# Patient Record
Sex: Male | Born: 1945 | Race: White | Hispanic: No
Health system: Southern US, Community
[De-identification: ages and names within clinical notes are randomized; demographics above are authoritative.]

---

## 2003-11-07 ENCOUNTER — Other Ambulatory Visit: Payer: Self-pay

## 2004-10-09 ENCOUNTER — Inpatient Hospital Stay: Payer: Self-pay

## 2004-12-18 ENCOUNTER — Ambulatory Visit: Payer: Self-pay | Admitting: Cardiovascular Disease

## 2004-12-19 ENCOUNTER — Ambulatory Visit: Payer: Self-pay | Admitting: Internal Medicine

## 2004-12-25 ENCOUNTER — Ambulatory Visit: Payer: Self-pay | Admitting: Oncology

## 2005-01-23 ENCOUNTER — Ambulatory Visit (HOSPITAL_COMMUNITY): Admission: RE | Admit: 2005-01-23 | Discharge: 2005-01-23 | Payer: Self-pay | Admitting: Oncology

## 2005-02-25 ENCOUNTER — Ambulatory Visit: Payer: Self-pay | Admitting: Oncology

## 2005-04-26 ENCOUNTER — Ambulatory Visit: Payer: Self-pay | Admitting: Oncology

## 2005-07-10 ENCOUNTER — Ambulatory Visit: Payer: Self-pay | Admitting: Oncology

## 2005-09-18 ENCOUNTER — Ambulatory Visit: Payer: Self-pay | Admitting: Oncology

## 2005-12-09 ENCOUNTER — Ambulatory Visit: Payer: Self-pay | Admitting: Oncology

## 2005-12-10 LAB — CBC WITH DIFFERENTIAL (CANCER CENTER ONLY)
Eosinophils Absolute: 0.2 10*3/uL (ref 0.0–0.5)
LYMPH#: 2.3 10*3/uL (ref 0.9–3.3)
LYMPH%: 37.2 % (ref 14.0–48.0)
MCH: 28.1 pg (ref 28.0–33.4)
MCHC: 32.6 g/dL (ref 32.0–35.9)
MONO%: 9.1 % (ref 0.0–13.0)
Platelets: 124 10*3/uL — ABNORMAL LOW (ref 145–400)
RBC: 5.21 10*6/uL (ref 4.20–5.70)
RDW: 14.4 % (ref 10.5–14.6)

## 2005-12-10 LAB — BASIC METABOLIC PANEL
CO2: 33 mEq/L — ABNORMAL HIGH (ref 19–32)
Chloride: 96 mEq/L (ref 96–112)

## 2005-12-10 LAB — LACTATE DEHYDROGENASE: LDH: 194 U/L (ref 94–250)

## 2006-02-03 ENCOUNTER — Ambulatory Visit: Payer: Self-pay | Admitting: Oncology

## 2006-02-04 LAB — CBC WITH DIFFERENTIAL (CANCER CENTER ONLY)
BASO%: 1 % (ref 0.0–2.0)
EOS%: 3 % (ref 0.0–7.0)
Eosinophils Absolute: 0.2 10*3/uL (ref 0.0–0.5)
HCT: 47.4 % (ref 38.7–49.9)
MCH: 29.3 pg (ref 28.0–33.4)
MONO#: 0.6 10*3/uL (ref 0.1–0.9)
MONO%: 9.1 % (ref 0.0–13.0)
NEUT#: 3 10*3/uL (ref 1.5–6.5)
NEUT%: 45.6 % (ref 40.0–80.0)
Platelets: 119 10*3/uL — ABNORMAL LOW (ref 145–400)
WBC: 6.6 10*3/uL (ref 4.0–10.0)

## 2006-03-04 LAB — CBC WITH DIFFERENTIAL (CANCER CENTER ONLY)
HCT: 44.8 % (ref 38.7–49.9)
HGB: 14.5 g/dL (ref 13.0–17.1)
MCHC: 32.4 g/dL (ref 32.0–35.9)
MONO#: 0.5 10*3/uL (ref 0.1–0.9)
NEUT#: 2.4 10*3/uL (ref 1.5–6.5)
NEUT%: 40 % (ref 40.0–80.0)
Platelets: 107 10*3/uL — ABNORMAL LOW (ref 145–400)
RBC: 5 10*6/uL (ref 4.20–5.70)
RDW: 13.9 % (ref 10.5–14.6)

## 2006-04-03 ENCOUNTER — Ambulatory Visit: Payer: Self-pay | Admitting: Family Medicine

## 2006-04-15 ENCOUNTER — Ambulatory Visit: Payer: Self-pay | Admitting: Oncology

## 2006-05-29 ENCOUNTER — Ambulatory Visit: Payer: Self-pay | Admitting: Oncology

## 2006-07-14 ENCOUNTER — Other Ambulatory Visit: Payer: Self-pay

## 2006-07-14 ENCOUNTER — Inpatient Hospital Stay: Payer: Self-pay | Admitting: Internal Medicine

## 2006-09-01 ENCOUNTER — Ambulatory Visit: Payer: Self-pay | Admitting: Oncology

## 2006-09-02 LAB — BASIC METABOLIC PANEL - CANCER CENTER ONLY
BUN, Bld: 13 mg/dL (ref 7–22)
Calcium: 9.8 mg/dL (ref 8.0–10.3)
Chloride: 94 mEq/L — ABNORMAL LOW (ref 98–108)
Creat: 0.8 mg/dl (ref 0.6–1.2)
Sodium: 139 mEq/L (ref 128–145)

## 2006-09-02 LAB — CBC WITH DIFFERENTIAL (CANCER CENTER ONLY)
BASO#: 0 10*3/uL (ref 0.0–0.2)
BASO%: 0.7 % (ref 0.0–2.0)
EOS%: 3.3 % (ref 0.0–7.0)
HCT: 46.9 % (ref 38.7–49.9)
LYMPH#: 2.6 10*3/uL (ref 0.9–3.3)
MCV: 91 fL (ref 82–98)
MONO%: 6.7 % (ref 0.0–13.0)
NEUT%: 45.1 % (ref 40.0–80.0)
RBC: 5.15 10*6/uL (ref 4.20–5.70)
RDW: 13.9 % (ref 10.5–14.6)
WBC: 5.9 10*3/uL (ref 4.0–10.0)

## 2006-09-18 LAB — CBC WITH DIFFERENTIAL (CANCER CENTER ONLY)
EOS%: 3.3 % (ref 0.0–7.0)
HCT: 44.1 % (ref 38.7–49.9)
HGB: 14.8 g/dL (ref 13.0–17.1)
MCHC: 33.5 g/dL (ref 32.0–35.9)
MCV: 90 fL (ref 82–98)
NEUT%: 45.2 % (ref 40.0–80.0)
RBC: 4.88 10*6/uL (ref 4.20–5.70)

## 2006-10-15 ENCOUNTER — Ambulatory Visit: Payer: Self-pay | Admitting: Oncology

## 2006-10-16 LAB — CBC WITH DIFFERENTIAL (CANCER CENTER ONLY)
BASO#: 0 10*3/uL (ref 0.0–0.2)
BASO%: 0.6 % (ref 0.0–2.0)
Eosinophils Absolute: 0.2 10*3/uL (ref 0.0–0.5)
HCT: 42.2 % (ref 38.7–49.9)
HGB: 14.2 g/dL (ref 13.0–17.1)
LYMPH#: 2.6 10*3/uL (ref 0.9–3.3)
MCHC: 33.5 g/dL (ref 32.0–35.9)
MCV: 88 fL (ref 82–98)
MONO#: 0.5 10*3/uL (ref 0.1–0.9)
MONO%: 8.5 % (ref 0.0–13.0)
RBC: 4.82 10*6/uL (ref 4.20–5.70)

## 2006-11-18 LAB — CBC WITH DIFFERENTIAL (CANCER CENTER ONLY)
BASO#: 0.1 10*3/uL (ref 0.0–0.2)
EOS%: 2.6 % (ref 0.0–7.0)
LYMPH%: 36.2 % (ref 14.0–48.0)
MCHC: 33.5 g/dL (ref 32.0–35.9)
MONO%: 9 % (ref 0.0–13.0)
NEUT%: 51.4 % (ref 40.0–80.0)
Platelets: 134 10*3/uL — ABNORMAL LOW (ref 145–400)
RBC: 5.44 10*6/uL (ref 4.20–5.70)
RDW: 13.1 % (ref 10.5–14.6)

## 2006-11-18 LAB — IRON AND TIBC
%SAT: 17 % — ABNORMAL LOW (ref 20–55)
Iron: 65 ug/dL (ref 42–165)
UIBC: 328 ug/dL

## 2006-12-22 ENCOUNTER — Ambulatory Visit: Payer: Self-pay | Admitting: Oncology

## 2006-12-23 LAB — CBC WITH DIFFERENTIAL (CANCER CENTER ONLY)
BASO%: 0.6 % (ref 0.0–2.0)
EOS%: 3 % (ref 0.0–7.0)
Eosinophils Absolute: 0.2 10*3/uL (ref 0.0–0.5)
HCT: 42.6 % (ref 38.7–49.9)
HGB: 13.9 g/dL (ref 13.0–17.1)
LYMPH#: 2.9 10*3/uL (ref 0.9–3.3)
MCV: 82 fL (ref 82–98)

## 2007-01-22 ENCOUNTER — Ambulatory Visit: Payer: Self-pay | Admitting: Cardiovascular Disease

## 2007-01-22 ENCOUNTER — Other Ambulatory Visit: Payer: Self-pay

## 2007-01-28 LAB — CBC WITH DIFFERENTIAL (CANCER CENTER ONLY)
BASO#: 0.1 10*3/uL (ref 0.0–0.2)
Eosinophils Absolute: 0.2 10*3/uL (ref 0.0–0.5)
HCT: 41 % (ref 38.7–49.9)
LYMPH#: 2.8 10*3/uL (ref 0.9–3.3)
LYMPH%: 40.3 % (ref 14.0–48.0)
NEUT#: 3.2 10*3/uL (ref 1.5–6.5)
Platelets: 152 10*3/uL (ref 145–400)
RBC: 5.03 10*6/uL (ref 4.20–5.70)
WBC: 6.9 10*3/uL (ref 4.0–10.0)

## 2007-03-09 ENCOUNTER — Ambulatory Visit: Payer: Self-pay | Admitting: Oncology

## 2007-03-13 LAB — CBC WITH DIFFERENTIAL (CANCER CENTER ONLY)
BASO#: 0 10*3/uL (ref 0.0–0.2)
BASO%: 0.5 % (ref 0.0–2.0)
LYMPH#: 2.9 10*3/uL (ref 0.9–3.3)
MONO%: 9.3 % (ref 0.0–13.0)
NEUT#: 2.7 10*3/uL (ref 1.5–6.5)
NEUT%: 41.8 % (ref 40.0–80.0)
Platelets: 134 10*3/uL — ABNORMAL LOW (ref 145–400)
RBC: 5.17 10*6/uL (ref 4.20–5.70)
RDW: 14.8 % — ABNORMAL HIGH (ref 10.5–14.6)
WBC: 6.4 10*3/uL (ref 4.0–10.0)

## 2007-03-13 LAB — IRON AND TIBC
%SAT: 16 % — ABNORMAL LOW (ref 20–55)
UIBC: 338 ug/dL

## 2007-03-13 LAB — FERRITIN: Ferritin: 8 ng/mL — ABNORMAL LOW (ref 22–322)

## 2007-03-20 IMAGING — CR DG CHEST 2V
1 series · 3 of 3 positions shown · non-contrast
Comparison: none

REASON FOR EXAM: Shortness of breath
COMMENTS:

[Series 1: view not recorded · 0.17mm/px · 3 of 3 slices shown]
[im 1/3]
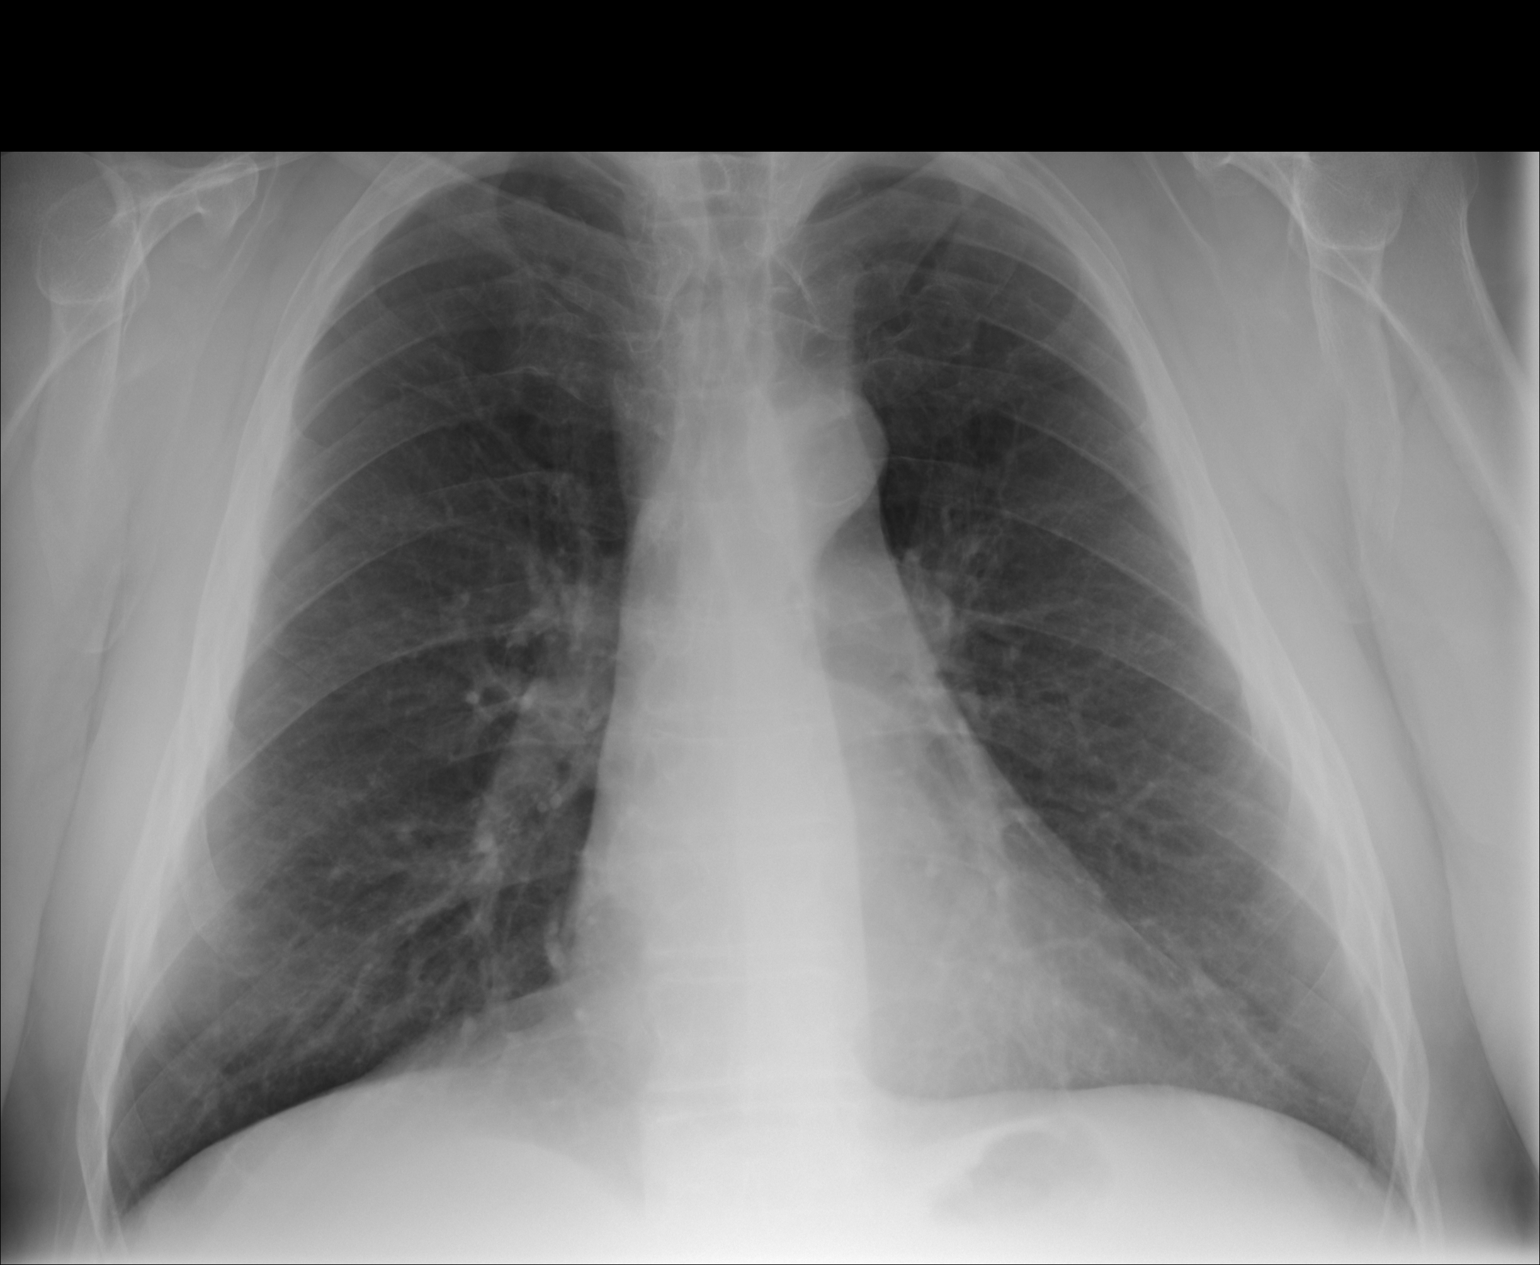
[im 2/3]
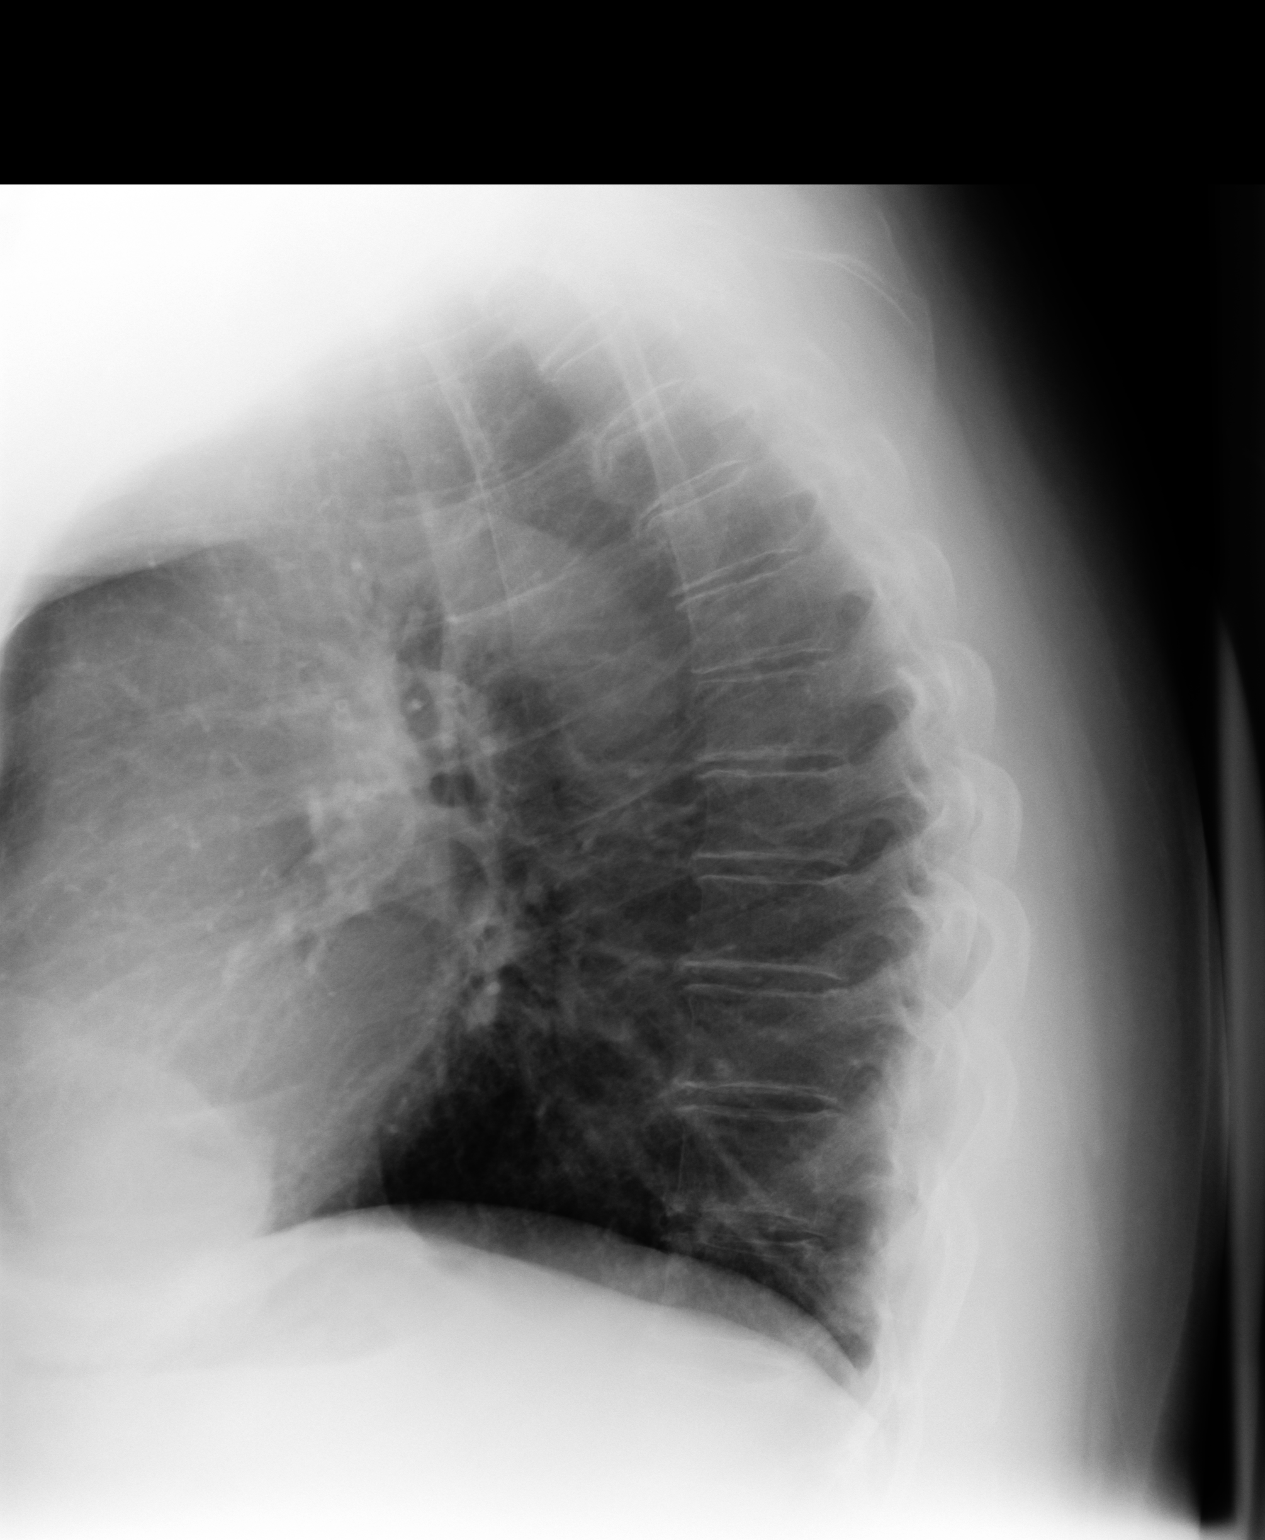
[im 3/3]
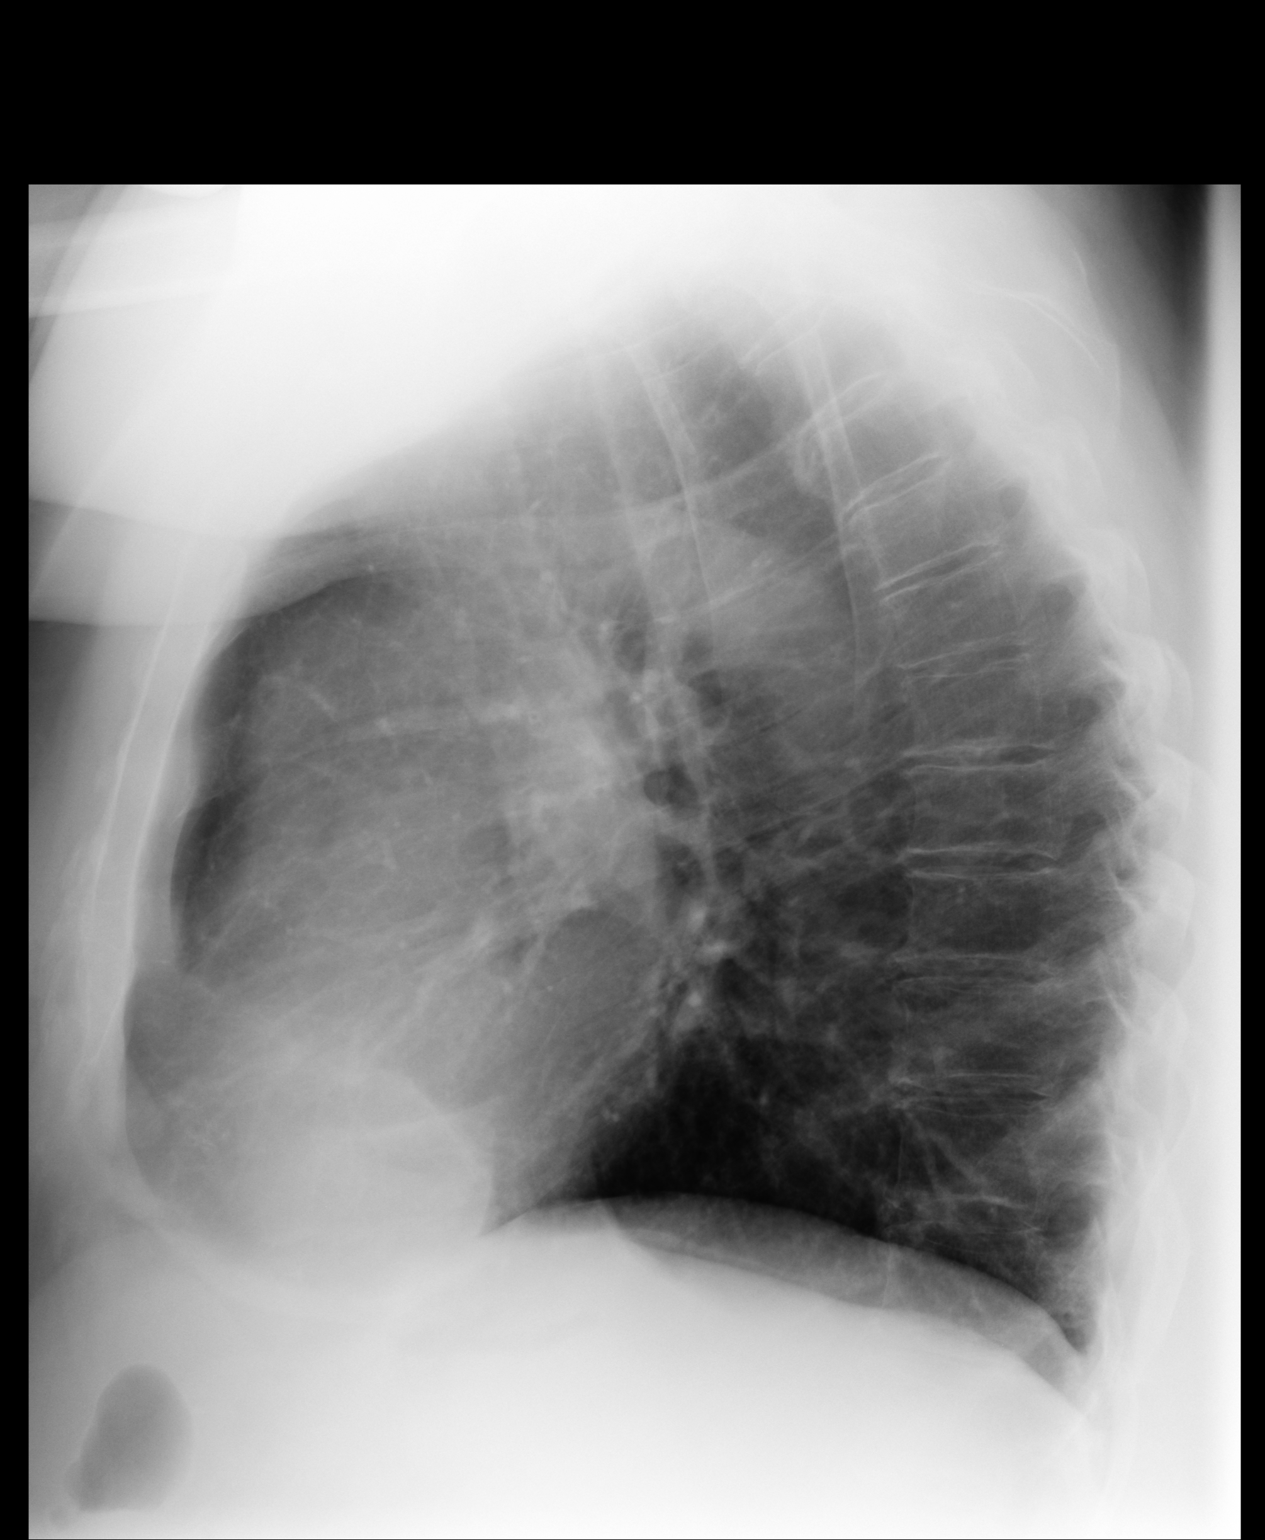

[3 of 3 positions shown; findings below may reference images not displayed]

PROCEDURE:     DXR - DXR CHEST PA (OR AP) AND LATERAL  - April 03, 2006 [DATE]

RESULT:     The current exam is compared to a prior exam of 10/09/2004. The
lung fields are clear. No pneumonia, pneumothorax or pleural effusion is
seen. The heart and pulmonary vasculature show no significant abnormalities.
The chest appears slightly hyperexpanded. This may be due to body habitus
but the possibility of minimal manifestation of COPD cannot be excluded and
is mentioned for correlation with clinical findings.
IMPRESSION: 1.     No acute changes are identified.
2.     Possible mild hyperexpansion of the lungs.

## 2007-04-28 ENCOUNTER — Ambulatory Visit: Payer: Self-pay | Admitting: Oncology

## 2007-04-30 LAB — CBC WITH DIFFERENTIAL (CANCER CENTER ONLY)
EOS%: 3.9 % (ref 0.0–7.0)
HCT: 39.4 % (ref 38.7–49.9)
HGB: 13.2 g/dL (ref 13.0–17.1)
LYMPH#: 1.9 10*3/uL (ref 0.9–3.3)
MCHC: 33.4 g/dL (ref 32.0–35.9)
NEUT%: 54.1 % (ref 40.0–80.0)
RBC: 4.78 10*6/uL (ref 4.20–5.70)

## 2007-05-12 ENCOUNTER — Ambulatory Visit: Payer: Self-pay | Admitting: Internal Medicine

## 2007-06-08 LAB — CBC WITH DIFFERENTIAL (CANCER CENTER ONLY)
Eosinophils Absolute: 0.2 10*3/uL (ref 0.0–0.5)
HGB: 13.6 g/dL (ref 13.0–17.1)
LYMPH#: 2 10*3/uL (ref 0.9–3.3)
MONO%: 8.3 % (ref 0.0–13.0)
NEUT#: 3.2 10*3/uL (ref 1.5–6.5)
Platelets: 113 10*3/uL — ABNORMAL LOW (ref 145–400)
WBC: 5.9 10*3/uL (ref 4.0–10.0)

## 2007-06-09 LAB — IRON AND TIBC: %SAT: 17 % — ABNORMAL LOW (ref 20–55)

## 2007-06-15 ENCOUNTER — Ambulatory Visit: Payer: Self-pay | Admitting: Internal Medicine

## 2007-09-01 ENCOUNTER — Ambulatory Visit: Payer: Self-pay | Admitting: Cardiovascular Disease

## 2007-11-26 ENCOUNTER — Ambulatory Visit: Payer: Self-pay | Admitting: Internal Medicine

## 2008-01-13 ENCOUNTER — Ambulatory Visit: Payer: Self-pay | Admitting: Gastroenterology

## 2008-01-14 ENCOUNTER — Ambulatory Visit: Payer: Self-pay | Admitting: Gastroenterology

## 2008-06-07 ENCOUNTER — Inpatient Hospital Stay: Payer: Self-pay | Admitting: Internal Medicine

## 2008-07-26 ENCOUNTER — Ambulatory Visit: Payer: Self-pay | Admitting: Family Medicine

## 2010-07-21 ENCOUNTER — Encounter: Payer: Self-pay | Admitting: Oncology

## 2010-07-22 ENCOUNTER — Encounter: Payer: Self-pay | Admitting: Oncology

## 2011-11-29 ENCOUNTER — Inpatient Hospital Stay: Payer: Self-pay | Admitting: Internal Medicine

## 2011-11-29 LAB — URINALYSIS, COMPLETE
Bilirubin,UR: NEGATIVE
Glucose,UR: >=500 mg/dL (ref 0–75)
Leukocyte Esterase: NEGATIVE
Ph: 6 (ref 4.5–8.0)
Protein: >=500
Specific Gravity: 1.028 (ref 1.003–1.030)
WBC UR: 4 /HPF (ref 0–5)

## 2011-11-29 LAB — COMPREHENSIVE METABOLIC PANEL
Albumin: 3 g/dL — ABNORMAL LOW (ref 3.4–5.0)
Bilirubin,Total: 1.6 mg/dL — ABNORMAL HIGH (ref 0.2–1.0)
Creatinine: 0.75 mg/dL (ref 0.60–1.30)
EGFR (Non-African Amer.): >60
Glucose: 180 mg/dL — ABNORMAL HIGH (ref 65–99)
Potassium: 3.8 mmol/L (ref 3.5–5.1)
SGPT (ALT): 28 U/L

## 2011-11-29 LAB — CK TOTAL AND CKMB (NOT AT ARMC)
CK, Total: 5231 U/L — ABNORMAL HIGH (ref 35–232)
CK-MB: 5.1 ng/mL — ABNORMAL HIGH (ref 0.5–3.6)

## 2011-11-29 LAB — CBC
HGB: 15 g/dL (ref 13.0–18.0)
MCH: 29.5 pg (ref 26.0–34.0)
MCHC: 34.3 g/dL (ref 32.0–36.0)
Platelet: 170 10*3/uL (ref 150–440)
RBC: 5.08 10*6/uL (ref 4.40–5.90)
RDW: 14.8 % — ABNORMAL HIGH (ref 11.5–14.5)

## 2011-11-29 LAB — TROPONIN I: Troponin-I: 0.04 ng/mL

## 2011-11-30 LAB — COMPREHENSIVE METABOLIC PANEL
BUN: 13 mg/dL (ref 7–18)
Creatinine: 0.56 mg/dL — ABNORMAL LOW (ref 0.60–1.30)
EGFR (Non-African Amer.): >60
Osmolality: 270 (ref 275–301)
Potassium: 3.6 mmol/L (ref 3.5–5.1)
SGOT(AST): 56 U/L — ABNORMAL HIGH (ref 15–37)
SGPT (ALT): 28 U/L
Sodium: 132 mmol/L — ABNORMAL LOW (ref 136–145)

## 2011-11-30 LAB — CBC WITH DIFFERENTIAL/PLATELET
Basophil %: 0.5 %
Eosinophil #: 0.2 10*3/uL (ref 0.0–0.7)
Eosinophil %: 1.7 %
HCT: 34.7 % — ABNORMAL LOW (ref 40.0–52.0)
Lymphocyte #: 1.2 10*3/uL (ref 1.0–3.6)
Lymphocyte %: 12.3 %
MCH: 29.2 pg (ref 26.0–34.0)
MCHC: 34.2 g/dL (ref 32.0–36.0)
MCV: 86 fL (ref 80–100)
Monocyte %: 11.4 %
Neutrophil %: 74.1 %
RBC: 4.06 10*6/uL — ABNORMAL LOW (ref 4.40–5.90)
RDW: 15 % — ABNORMAL HIGH (ref 11.5–14.5)
WBC: 9.4 10*3/uL (ref 3.8–10.6)

## 2011-11-30 LAB — HEMOGLOBIN A1C: Hemoglobin A1C: 10.5 % — ABNORMAL HIGH (ref 4.2–6.3)

## 2011-11-30 LAB — CK-MB: CK-MB: 1.6 ng/mL (ref 0.5–3.6)

## 2011-11-30 LAB — URINE CULTURE

## 2011-11-30 LAB — TROPONIN I: Troponin-I: <0.02 ng/mL

## 2011-11-30 LAB — PROTIME-INR: INR: 1.1

## 2011-12-01 LAB — BASIC METABOLIC PANEL
Anion Gap: 9 (ref 7–16)
BUN: 9 mg/dL (ref 7–18)
Calcium, Total: 8.3 mg/dL — ABNORMAL LOW (ref 8.5–10.1)
Chloride: 96 mmol/L — ABNORMAL LOW (ref 98–107)
EGFR (Non-African Amer.): >60
Glucose: 228 mg/dL — ABNORMAL HIGH (ref 65–99)
Osmolality: 270 (ref 275–301)
Sodium: 132 mmol/L — ABNORMAL LOW (ref 136–145)

## 2011-12-01 LAB — CK: CK, Total: 263 U/L — ABNORMAL HIGH (ref 35–232)

## 2011-12-02 LAB — CBC WITH DIFFERENTIAL/PLATELET
Basophil #: 0.1 10*3/uL (ref 0.0–0.1)
HCT: 35.5 % — ABNORMAL LOW (ref 40.0–52.0)
HGB: 12 g/dL — ABNORMAL LOW (ref 13.0–18.0)
Lymphocyte #: 1.4 10*3/uL (ref 1.0–3.6)
Lymphocyte %: 19.6 %
MCHC: 33.7 g/dL (ref 32.0–36.0)
Monocyte %: 11.5 %
Neutrophil %: 64.4 %
RBC: 4.15 10*6/uL — ABNORMAL LOW (ref 4.40–5.90)
RDW: 14.5 % (ref 11.5–14.5)

## 2011-12-03 LAB — BASIC METABOLIC PANEL
Anion Gap: 8 (ref 7–16)
BUN: 9 mg/dL (ref 7–18)
Calcium, Total: 8.9 mg/dL (ref 8.5–10.1)
Co2: 28 mmol/L (ref 21–32)
EGFR (Non-African Amer.): >60
Potassium: 4.2 mmol/L (ref 3.5–5.1)
Sodium: 132 mmol/L — ABNORMAL LOW (ref 136–145)

## 2011-12-03 LAB — PATHOLOGY REPORT

## 2011-12-04 LAB — CULTURE, BLOOD (SINGLE)

## 2011-12-04 LAB — WOUND CULTURE

## 2011-12-05 LAB — BASIC METABOLIC PANEL
BUN: 17 mg/dL (ref 7–18)
Chloride: 95 mmol/L — ABNORMAL LOW (ref 98–107)
Co2: 28 mmol/L (ref 21–32)
EGFR (African American): >60
EGFR (Non-African Amer.): >60
Glucose: 259 mg/dL — ABNORMAL HIGH (ref 65–99)
Osmolality: 269 (ref 275–301)
Potassium: 4.8 mmol/L (ref 3.5–5.1)

## 2011-12-05 LAB — MAGNESIUM: Magnesium: 1.7 mg/dL — ABNORMAL LOW

## 2012-07-03 ENCOUNTER — Inpatient Hospital Stay: Payer: Self-pay | Admitting: Internal Medicine

## 2012-07-03 LAB — COMPREHENSIVE METABOLIC PANEL
Alkaline Phosphatase: 236 U/L — ABNORMAL HIGH (ref 50–136)
Anion Gap: 7 (ref 7–16)
Bilirubin,Total: 0.6 mg/dL (ref 0.2–1.0)
Chloride: 95 mmol/L — ABNORMAL LOW (ref 98–107)
EGFR (Non-African Amer.): 57 — ABNORMAL LOW
Osmolality: 281 (ref 275–301)
SGOT(AST): 14 U/L — ABNORMAL LOW (ref 15–37)
Total Protein: 7.8 g/dL (ref 6.4–8.2)

## 2012-07-03 LAB — ETHANOL
Ethanol %: <0.003 % (ref 0.000–0.080)
Ethanol: <3 mg/dL

## 2012-07-03 LAB — URINALYSIS, COMPLETE
Bilirubin,UR: NEGATIVE
Glucose,UR: >=500 mg/dL (ref 0–75)
Ketone: NEGATIVE
Nitrite: NEGATIVE
Ph: 6 (ref 4.5–8.0)
Protein: 30
RBC,UR: 11 /HPF (ref 0–5)
Specific Gravity: 1.013 (ref 1.003–1.030)
Squamous Epithelial: NONE SEEN

## 2012-07-03 LAB — CBC WITH DIFFERENTIAL/PLATELET
Basophil %: 0.3 %
Eosinophil %: 0.2 %
HCT: 35 % — ABNORMAL LOW (ref 40.0–52.0)
HGB: 11.5 g/dL — ABNORMAL LOW (ref 13.0–18.0)
Lymphocyte #: 1.1 10*3/uL (ref 1.0–3.6)
Lymphocyte %: 6.7 %
MCH: 28 pg (ref 26.0–34.0)
MCV: 85 fL (ref 80–100)
Neutrophil #: 13.3 10*3/uL — ABNORMAL HIGH (ref 1.4–6.5)
Platelet: 176 10*3/uL (ref 150–440)
RBC: 4.1 10*6/uL — ABNORMAL LOW (ref 4.40–5.90)
RDW: 16.2 % — ABNORMAL HIGH (ref 11.5–14.5)
WBC: 15.7 10*3/uL — ABNORMAL HIGH (ref 3.8–10.6)

## 2012-07-03 LAB — CK TOTAL AND CKMB (NOT AT ARMC): CK, Total: 47 U/L (ref 35–232)

## 2012-07-04 LAB — CBC WITH DIFFERENTIAL/PLATELET
Basophil %: 0.4 %
HCT: 32.5 % — ABNORMAL LOW (ref 40.0–52.0)
HGB: 11.1 g/dL — ABNORMAL LOW (ref 13.0–18.0)
Lymphocyte #: 1.4 10*3/uL (ref 1.0–3.6)
MCH: 28.3 pg (ref 26.0–34.0)
Monocyte #: 1.4 x10 3/mm — ABNORMAL HIGH (ref 0.2–1.0)
Platelet: 162 10*3/uL (ref 150–440)
RBC: 3.91 10*6/uL — ABNORMAL LOW (ref 4.40–5.90)
WBC: 14.3 10*3/uL — ABNORMAL HIGH (ref 3.8–10.6)

## 2012-07-04 LAB — BASIC METABOLIC PANEL
Anion Gap: 9 (ref 7–16)
Calcium, Total: 8.8 mg/dL (ref 8.5–10.1)
Chloride: 107 mmol/L (ref 98–107)
Co2: 23 mmol/L (ref 21–32)
Creatinine: 0.94 mg/dL (ref 0.60–1.30)
EGFR (African American): >60
EGFR (Non-African Amer.): >60
Potassium: 4.2 mmol/L (ref 3.5–5.1)

## 2012-07-04 LAB — DRUG SCREEN, URINE
Amphetamines, Ur Screen: NEGATIVE (ref ?–1000)
Cannabinoid 50 Ng, Ur ~~LOC~~: NEGATIVE (ref ?–50)
Cocaine Metabolite,Ur ~~LOC~~: NEGATIVE (ref ?–300)
MDMA (Ecstasy)Ur Screen: NEGATIVE (ref ?–500)
Methadone, Ur Screen: NEGATIVE (ref ?–300)
Opiate, Ur Screen: NEGATIVE (ref ?–300)
Tricyclic, Ur Screen: NEGATIVE (ref ?–1000)

## 2012-07-04 LAB — TROPONIN I
Troponin-I: <0.02 ng/mL
Troponin-I: <0.02 ng/mL

## 2012-07-04 LAB — LIPID PANEL
Cholesterol: 94 mg/dL (ref 0–200)
Ldl Cholesterol, Calc: 39 mg/dL (ref 0–100)
Triglycerides: 114 mg/dL (ref 0–200)
VLDL Cholesterol, Calc: 23 mg/dL (ref 5–40)

## 2012-07-05 ENCOUNTER — Ambulatory Visit: Payer: Self-pay | Admitting: Urology

## 2012-07-05 LAB — CBC WITH DIFFERENTIAL/PLATELET
Basophil %: 0.4 %
HCT: 29.1 % — ABNORMAL LOW (ref 40.0–52.0)
Lymphocyte #: 1.3 10*3/uL (ref 1.0–3.6)
Lymphocyte %: 11.4 %
MCH: 27.7 pg (ref 26.0–34.0)
MCHC: 32.9 g/dL (ref 32.0–36.0)
Monocyte %: 8.2 %
Neutrophil #: 8.8 10*3/uL — ABNORMAL HIGH (ref 1.4–6.5)
Neutrophil %: 79 %
Platelet: 137 10*3/uL — ABNORMAL LOW (ref 150–440)

## 2012-07-05 LAB — BASIC METABOLIC PANEL
Anion Gap: 7 (ref 7–16)
BUN: 16 mg/dL (ref 7–18)
Calcium, Total: 8.2 mg/dL — ABNORMAL LOW (ref 8.5–10.1)
Co2: 23 mmol/L (ref 21–32)
Creatinine: 0.98 mg/dL (ref 0.60–1.30)
EGFR (African American): >60
Potassium: 4.4 mmol/L (ref 3.5–5.1)
Sodium: 130 mmol/L — ABNORMAL LOW (ref 136–145)

## 2012-07-05 LAB — HEMOGLOBIN A1C: Hemoglobin A1C: 12 % — ABNORMAL HIGH (ref 4.2–6.3)

## 2012-07-06 LAB — BASIC METABOLIC PANEL
Anion Gap: 9 (ref 7–16)
BUN: 13 mg/dL (ref 7–18)
Chloride: 98 mmol/L (ref 98–107)
Co2: 24 mmol/L (ref 21–32)
Glucose: 262 mg/dL — ABNORMAL HIGH (ref 65–99)
Osmolality: 272 (ref 275–301)
Potassium: 4.1 mmol/L (ref 3.5–5.1)
Sodium: 131 mmol/L — ABNORMAL LOW (ref 136–145)

## 2012-07-06 LAB — CULTURE, BLOOD (SINGLE)

## 2012-07-06 LAB — URINE CULTURE

## 2012-07-07 LAB — CBC WITH DIFFERENTIAL/PLATELET
Basophil %: 0.5 %
Eosinophil #: 0.1 10*3/uL (ref 0.0–0.7)
Eosinophil %: 1.4 %
Lymphocyte #: 1.7 10*3/uL (ref 1.0–3.6)
MCHC: 33.2 g/dL (ref 32.0–36.0)
MCV: 84 fL (ref 80–100)
Monocyte %: 8.7 %
Neutrophil %: 72.8 %
Platelet: 153 10*3/uL (ref 150–440)
RDW: 16.6 % — ABNORMAL HIGH (ref 11.5–14.5)
WBC: 10.2 10*3/uL (ref 3.8–10.6)

## 2012-07-07 LAB — CULTURE, BLOOD (SINGLE)

## 2012-07-11 LAB — CULTURE, BLOOD (SINGLE)

## 2012-12-17 ENCOUNTER — Inpatient Hospital Stay: Payer: Self-pay | Admitting: Internal Medicine

## 2012-12-17 LAB — COMPREHENSIVE METABOLIC PANEL
Alkaline Phosphatase: 106 U/L (ref 50–136)
Bilirubin,Total: 1.4 mg/dL — ABNORMAL HIGH (ref 0.2–1.0)
Chloride: 96 mmol/L — ABNORMAL LOW (ref 98–107)
EGFR (African American): 58 — ABNORMAL LOW
Osmolality: 278 (ref 275–301)
SGOT(AST): 43 U/L — ABNORMAL HIGH (ref 15–37)
SGPT (ALT): 32 U/L (ref 12–78)
Sodium: 130 mmol/L — ABNORMAL LOW (ref 136–145)

## 2012-12-17 LAB — CBC
HCT: 35.1 % — ABNORMAL LOW (ref 40.0–52.0)
HGB: 12.2 g/dL — ABNORMAL LOW (ref 13.0–18.0)
MCH: 29.9 pg (ref 26.0–34.0)
MCHC: 34.8 g/dL (ref 32.0–36.0)
MCV: 86 fL (ref 80–100)

## 2012-12-17 LAB — ETHANOL
Ethanol %: <0.003 % (ref 0.000–0.080)
Ethanol: <3 mg/dL

## 2012-12-17 LAB — URINALYSIS, COMPLETE
Bilirubin,UR: NEGATIVE
Glucose,UR: >=500 mg/dL (ref 0–75)
Ph: 5 (ref 4.5–8.0)
Specific Gravity: 1.013 (ref 1.003–1.030)
Squamous Epithelial: NONE SEEN

## 2012-12-17 LAB — CLOSTRIDIUM DIFFICILE BY PCR

## 2012-12-17 LAB — CK TOTAL AND CKMB (NOT AT ARMC)
CK, Total: 456 U/L — ABNORMAL HIGH (ref 35–232)
CK-MB: 3 ng/mL (ref 0.5–3.6)

## 2012-12-17 LAB — TROPONIN I: Troponin-I: <0.02 ng/mL

## 2012-12-18 LAB — CBC WITH DIFFERENTIAL/PLATELET
Basophil #: 0.1 10*3/uL (ref 0.0–0.1)
HCT: 31.2 % — ABNORMAL LOW (ref 40.0–52.0)
Lymphocyte #: 1.1 10*3/uL (ref 1.0–3.6)
MCH: 29.5 pg (ref 26.0–34.0)
MCV: 86 fL (ref 80–100)
Monocyte #: 0.8 x10 3/mm (ref 0.2–1.0)
Neutrophil #: 3.8 10*3/uL (ref 1.4–6.5)
WBC: 5.9 10*3/uL (ref 3.8–10.6)

## 2012-12-18 LAB — LIPID PANEL
Cholesterol: 77 mg/dL (ref 0–200)
HDL Cholesterol: 15 mg/dL — ABNORMAL LOW (ref 40–60)

## 2012-12-18 LAB — MAGNESIUM: Magnesium: 1.7 mg/dL — ABNORMAL LOW

## 2012-12-18 LAB — BASIC METABOLIC PANEL
Anion Gap: 4 — ABNORMAL LOW (ref 7–16)
BUN: 16 mg/dL (ref 7–18)
Co2: 27 mmol/L (ref 21–32)
EGFR (African American): >60
Glucose: 224 mg/dL — ABNORMAL HIGH (ref 65–99)
Potassium: 4.3 mmol/L (ref 3.5–5.1)

## 2012-12-18 LAB — HEMOGLOBIN A1C: Hemoglobin A1C: 7.8 % — ABNORMAL HIGH (ref 4.2–6.3)

## 2013-02-24 ENCOUNTER — Ambulatory Visit: Payer: Self-pay | Admitting: Family

## 2013-07-29 ENCOUNTER — Ambulatory Visit: Payer: Self-pay | Admitting: Gastroenterology

## 2013-07-31 LAB — PATHOLOGY REPORT

## 2013-08-19 ENCOUNTER — Inpatient Hospital Stay: Payer: Self-pay | Admitting: Internal Medicine

## 2013-08-19 LAB — BASIC METABOLIC PANEL
Anion Gap: 1 — ABNORMAL LOW (ref 7–16)
BUN: 15 mg/dL (ref 7–18)
CHLORIDE: 103 mmol/L (ref 98–107)
Calcium, Total: 9.4 mg/dL (ref 8.5–10.1)
Co2: 36 mmol/L — ABNORMAL HIGH (ref 21–32)
Creatinine: 0.98 mg/dL (ref 0.60–1.30)
EGFR (African American): >60
GLUCOSE: 88 mg/dL (ref 65–99)
Osmolality: 280 (ref 275–301)
POTASSIUM: 4.6 mmol/L (ref 3.5–5.1)
SODIUM: 140 mmol/L (ref 136–145)

## 2013-08-19 LAB — CBC
HCT: 33.4 % — ABNORMAL LOW (ref 40.0–52.0)
HGB: 11 g/dL — AB (ref 13.0–18.0)
MCH: 28.1 pg (ref 26.0–34.0)
MCHC: 33 g/dL (ref 32.0–36.0)
MCV: 85 fL (ref 80–100)
Platelet: 88 10*3/uL — ABNORMAL LOW (ref 150–440)
RBC: 3.91 10*6/uL — ABNORMAL LOW (ref 4.40–5.90)
RDW: 17.4 % — AB (ref 11.5–14.5)
WBC: 6.6 10*3/uL (ref 3.8–10.6)

## 2013-08-19 LAB — URINALYSIS, COMPLETE
Bacteria: NONE SEEN
Bilirubin,UR: NEGATIVE
Glucose,UR: NEGATIVE mg/dL (ref 0–75)
KETONE: NEGATIVE
LEUKOCYTE ESTERASE: NEGATIVE
Nitrite: NEGATIVE
PH: 7 (ref 4.5–8.0)
Protein: 100
RBC,UR: 14 /HPF (ref 0–5)
SQUAMOUS EPITHELIAL: NONE SEEN
Specific Gravity: 1.005 (ref 1.003–1.030)
WBC UR: <1 /HPF (ref 0–5)

## 2013-08-19 LAB — PROTIME-INR
INR: 1
PROTHROMBIN TIME: 13.5 s (ref 11.5–14.7)

## 2013-08-19 LAB — CK-MB
CK-MB: 3.9 ng/mL — AB (ref 0.5–3.6)
CK-MB: 4.3 ng/mL — ABNORMAL HIGH (ref 0.5–3.6)

## 2013-08-19 LAB — TROPONIN I: TROPONIN-I: 0.02 ng/mL

## 2013-08-19 LAB — PRO B NATRIURETIC PEPTIDE: B-TYPE NATIURETIC PEPTID: 3807 pg/mL — AB (ref 0–125)

## 2013-08-20 DIAGNOSIS — I5043 Acute on chronic combined systolic (congestive) and diastolic (congestive) heart failure: Secondary | ICD-10-CM

## 2013-08-20 DIAGNOSIS — I059 Rheumatic mitral valve disease, unspecified: Secondary | ICD-10-CM

## 2013-08-20 DIAGNOSIS — I4891 Unspecified atrial fibrillation: Secondary | ICD-10-CM

## 2013-08-20 LAB — BASIC METABOLIC PANEL
ANION GAP: 2 — AB (ref 7–16)
BUN: 15 mg/dL (ref 7–18)
CALCIUM: 8.9 mg/dL (ref 8.5–10.1)
CHLORIDE: 101 mmol/L (ref 98–107)
CREATININE: 1.06 mg/dL (ref 0.60–1.30)
Co2: 36 mmol/L — ABNORMAL HIGH (ref 21–32)
EGFR (African American): >60
EGFR (Non-African Amer.): >60
Glucose: 101 mg/dL — ABNORMAL HIGH (ref 65–99)
Osmolality: 279 (ref 275–301)
POTASSIUM: 4 mmol/L (ref 3.5–5.1)
Sodium: 139 mmol/L (ref 136–145)

## 2013-08-20 LAB — CK-MB: CK-MB: 4.1 ng/mL — AB (ref 0.5–3.6)

## 2013-08-20 LAB — LIPID PANEL
Cholesterol: 96 mg/dL (ref 0–200)
HDL: 36 mg/dL — AB (ref 40–60)
Ldl Cholesterol, Calc: 34 mg/dL (ref 0–100)
Triglycerides: 131 mg/dL (ref 0–200)
VLDL Cholesterol, Calc: 26 mg/dL (ref 5–40)

## 2013-08-20 LAB — TROPONIN I

## 2013-08-20 LAB — TSH: THYROID STIMULATING HORM: 4.75 u[IU]/mL — AB

## 2013-08-23 ENCOUNTER — Telehealth: Payer: Self-pay

## 2013-08-23 NOTE — Telephone Encounter (Signed)
Called pt to schedule hosp f/u. [facility] [phone]). [facility] to schedule [facility]t.

## 2013-08-30 ENCOUNTER — Emergency Department: Payer: Self-pay | Admitting: Emergency Medicine

## 2013-08-30 LAB — URINALYSIS, COMPLETE
Bacteria: NONE SEEN
Bilirubin,UR: NEGATIVE
Glucose,UR: 50 mg/dL (ref 0–75)
Ketone: NEGATIVE
Leukocyte Esterase: NEGATIVE
Nitrite: NEGATIVE
Ph: 6 (ref 4.5–8.0)
Protein: >=500
RBC,UR: 21 /HPF (ref 0–5)
Specific Gravity: 1.014 (ref 1.003–1.030)
Squamous Epithelial: NONE SEEN
WBC UR: <1 /HPF (ref 0–5)

## 2013-08-30 LAB — CBC
HCT: 31.5 % — ABNORMAL LOW (ref 40.0–52.0)
HGB: 10.3 g/dL — ABNORMAL LOW (ref 13.0–18.0)
MCH: 27.9 pg (ref 26.0–34.0)
MCHC: 32.5 g/dL (ref 32.0–36.0)
MCV: 86 fL (ref 80–100)
PLATELETS: 134 10*3/uL — AB (ref 150–440)
RBC: 3.68 10*6/uL — AB (ref 4.40–5.90)
RDW: 18.1 % — ABNORMAL HIGH (ref 11.5–14.5)
WBC: 7.7 10*3/uL (ref 3.8–10.6)

## 2013-08-30 LAB — COMPREHENSIVE METABOLIC PANEL
ANION GAP: 1 — AB (ref 7–16)
Albumin: 2.9 g/dL — ABNORMAL LOW (ref 3.4–5.0)
Alkaline Phosphatase: 88 U/L
BILIRUBIN TOTAL: 0.5 mg/dL (ref 0.2–1.0)
BUN: 18 mg/dL (ref 7–18)
Calcium, Total: 8.6 mg/dL (ref 8.5–10.1)
Chloride: 103 mmol/L (ref 98–107)
Co2: 34 mmol/L — ABNORMAL HIGH (ref 21–32)
Creatinine: 1.03 mg/dL (ref 0.60–1.30)
EGFR (Non-African Amer.): >60
Glucose: 201 mg/dL — ABNORMAL HIGH (ref 65–99)
Osmolality: 283 (ref 275–301)
POTASSIUM: 4.6 mmol/L (ref 3.5–5.1)
SGOT(AST): 46 U/L — ABNORMAL HIGH (ref 15–37)
SGPT (ALT): 32 U/L (ref 12–78)
Sodium: 138 mmol/L (ref 136–145)
TOTAL PROTEIN: 7.3 g/dL (ref 6.4–8.2)

## 2013-08-30 LAB — DRUG SCREEN, URINE
Amphetamines, Ur Screen: NEGATIVE (ref ?–1000)
Barbiturates, Ur Screen: NEGATIVE (ref ?–200)
Benzodiazepine, Ur Scrn: NEGATIVE (ref ?–200)
COCAINE METABOLITE, UR ~~LOC~~: NEGATIVE (ref ?–300)
Cannabinoid 50 Ng, Ur ~~LOC~~: NEGATIVE (ref ?–50)
MDMA (ECSTASY) UR SCREEN: POSITIVE (ref ?–500)
Methadone, Ur Screen: NEGATIVE (ref ?–300)
Opiate, Ur Screen: NEGATIVE (ref ?–300)
Phencyclidine (PCP) Ur S: NEGATIVE (ref ?–25)
Tricyclic, Ur Screen: NEGATIVE (ref ?–1000)

## 2013-08-30 LAB — TROPONIN I: TROPONIN-I: 0.03 ng/mL

## 2013-08-30 LAB — PRO B NATRIURETIC PEPTIDE: B-Type Natriuretic Peptide: 7435 pg/mL — ABNORMAL HIGH (ref 0–125)

## 2013-08-30 LAB — ETHANOL

## 2013-08-30 LAB — AMMONIA: Ammonia, Plasma: 36 mcmol/L — ABNORMAL HIGH (ref 11–32)

## 2013-08-30 LAB — LIPASE, BLOOD: Lipase: 54 U/L — ABNORMAL LOW (ref 73–393)

## 2013-09-04 LAB — CULTURE, BLOOD (SINGLE)

## 2013-09-10 ENCOUNTER — Inpatient Hospital Stay: Payer: Self-pay | Admitting: Internal Medicine

## 2013-09-10 LAB — URINALYSIS, COMPLETE
BACTERIA: NONE SEEN
Bilirubin,UR: NEGATIVE
Glucose,UR: 50 mg/dL (ref 0–75)
Hyaline Cast: 6
Ketone: NEGATIVE
LEUKOCYTE ESTERASE: NEGATIVE
Nitrite: NEGATIVE
Ph: 5 (ref 4.5–8.0)
RBC,UR: 3 /HPF (ref 0–5)
SPECIFIC GRAVITY: 1.009 (ref 1.003–1.030)
Squamous Epithelial: NONE SEEN
WBC UR: NONE SEEN /HPF (ref 0–5)

## 2013-09-10 LAB — BASIC METABOLIC PANEL
Anion Gap: 3 — ABNORMAL LOW (ref 7–16)
BUN: 19 mg/dL — AB (ref 7–18)
CO2: 32 mmol/L (ref 21–32)
Calcium, Total: 8.4 mg/dL — ABNORMAL LOW (ref 8.5–10.1)
Chloride: 100 mmol/L (ref 98–107)
Creatinine: 1.29 mg/dL (ref 0.60–1.30)
EGFR (African American): >60
EGFR (Non-African Amer.): 57 — ABNORMAL LOW
Glucose: 187 mg/dL — ABNORMAL HIGH (ref 65–99)
Osmolality: 277 (ref 275–301)
Potassium: 4.6 mmol/L (ref 3.5–5.1)
Sodium: 135 mmol/L — ABNORMAL LOW (ref 136–145)

## 2013-09-10 LAB — CBC WITH DIFFERENTIAL/PLATELET
BASOS PCT: 0.5 %
Basophil #: 0 10*3/uL (ref 0.0–0.1)
EOS PCT: 1.5 %
Eosinophil #: 0.1 10*3/uL (ref 0.0–0.7)
HCT: 31.7 % — AB (ref 40.0–52.0)
HGB: 9.9 g/dL — AB (ref 13.0–18.0)
LYMPHS ABS: 1 10*3/uL (ref 1.0–3.6)
Lymphocyte %: 14.7 %
MCH: 26.6 pg (ref 26.0–34.0)
MCHC: 31.3 g/dL — AB (ref 32.0–36.0)
MCV: 85 fL (ref 80–100)
MONO ABS: 0.4 x10 3/mm (ref 0.2–1.0)
MONOS PCT: 5.2 %
NEUTROS ABS: 5.4 10*3/uL (ref 1.4–6.5)
Neutrophil %: 78.1 %
Platelet: 116 10*3/uL — ABNORMAL LOW (ref 150–440)
RBC: 3.74 10*6/uL — AB (ref 4.40–5.90)
RDW: 17.7 % — ABNORMAL HIGH (ref 11.5–14.5)
WBC: 6.9 10*3/uL (ref 3.8–10.6)

## 2013-09-11 LAB — CBC WITH DIFFERENTIAL/PLATELET
BASOS ABS: 0 10*3/uL (ref 0.0–0.1)
Basophil %: 0.2 %
Eosinophil #: 0 10*3/uL (ref 0.0–0.7)
Eosinophil %: 0.1 %
HCT: 32.3 % — ABNORMAL LOW (ref 40.0–52.0)
HGB: 10.3 g/dL — ABNORMAL LOW (ref 13.0–18.0)
Lymphocyte #: 0.5 10*3/uL — ABNORMAL LOW (ref 1.0–3.6)
Lymphocyte %: 9.6 %
MCH: 26.9 pg (ref 26.0–34.0)
MCHC: 31.8 g/dL — AB (ref 32.0–36.0)
MCV: 85 fL (ref 80–100)
MONO ABS: 0.1 x10 3/mm — AB (ref 0.2–1.0)
Monocyte %: 2 %
Neutrophil #: 4.6 10*3/uL (ref 1.4–6.5)
Neutrophil %: 88.1 %
PLATELETS: 98 10*3/uL — AB (ref 150–440)
RBC: 3.81 10*6/uL — ABNORMAL LOW (ref 4.40–5.90)
RDW: 17.4 % — ABNORMAL HIGH (ref 11.5–14.5)
WBC: 5.2 10*3/uL (ref 3.8–10.6)

## 2013-09-11 LAB — COMPREHENSIVE METABOLIC PANEL
ALBUMIN: 2.7 g/dL — AB (ref 3.4–5.0)
AST: 34 U/L (ref 15–37)
Alkaline Phosphatase: 87 U/L
Anion Gap: 4 — ABNORMAL LOW (ref 7–16)
BILIRUBIN TOTAL: 0.4 mg/dL (ref 0.2–1.0)
BUN: 23 mg/dL — AB (ref 7–18)
CALCIUM: 8.5 mg/dL (ref 8.5–10.1)
CREATININE: 1.43 mg/dL — AB (ref 0.60–1.30)
Chloride: 96 mmol/L — ABNORMAL LOW (ref 98–107)
Co2: 33 mmol/L — ABNORMAL HIGH (ref 21–32)
EGFR (African American): 58 — ABNORMAL LOW
GFR CALC NON AF AMER: 50 — AB
GLUCOSE: 377 mg/dL — AB (ref 65–99)
Osmolality: 286 (ref 275–301)
POTASSIUM: 5.2 mmol/L — AB (ref 3.5–5.1)
SGPT (ALT): 53 U/L (ref 12–78)
Sodium: 133 mmol/L — ABNORMAL LOW (ref 136–145)
Total Protein: 6.8 g/dL (ref 6.4–8.2)

## 2013-09-12 LAB — BASIC METABOLIC PANEL
ANION GAP: 3 — AB (ref 7–16)
BUN: 31 mg/dL — ABNORMAL HIGH (ref 7–18)
CREATININE: 1.37 mg/dL — AB (ref 0.60–1.30)
Calcium, Total: 9 mg/dL (ref 8.5–10.1)
Chloride: 96 mmol/L — ABNORMAL LOW (ref 98–107)
Co2: 34 mmol/L — ABNORMAL HIGH (ref 21–32)
EGFR (African American): >60
EGFR (Non-African Amer.): 53 — ABNORMAL LOW
GLUCOSE: 274 mg/dL — AB (ref 65–99)
Osmolality: 283 (ref 275–301)
Potassium: 4.9 mmol/L (ref 3.5–5.1)
Sodium: 133 mmol/L — ABNORMAL LOW (ref 136–145)

## 2013-09-13 LAB — BASIC METABOLIC PANEL
ANION GAP: 5 — AB (ref 7–16)
BUN: 37 mg/dL — ABNORMAL HIGH (ref 7–18)
CALCIUM: 8.7 mg/dL (ref 8.5–10.1)
CHLORIDE: 94 mmol/L — AB (ref 98–107)
CO2: 32 mmol/L (ref 21–32)
Creatinine: 1.41 mg/dL — ABNORMAL HIGH (ref 0.60–1.30)
EGFR (African American): 59 — ABNORMAL LOW
GFR CALC NON AF AMER: 51 — AB
GLUCOSE: 347 mg/dL — AB (ref 65–99)
Osmolality: 285 (ref 275–301)
Potassium: 4.8 mmol/L (ref 3.5–5.1)
Sodium: 131 mmol/L — ABNORMAL LOW (ref 136–145)

## 2013-09-25 ENCOUNTER — Other Ambulatory Visit: Payer: Self-pay | Admitting: Family

## 2013-09-25 LAB — BASIC METABOLIC PANEL
ANION GAP: 3 — AB (ref 7–16)
BUN: 23 mg/dL — AB (ref 7–18)
CHLORIDE: 102 mmol/L (ref 98–107)
CREATININE: 1.46 mg/dL — AB (ref 0.60–1.30)
Calcium, Total: 7.6 mg/dL — ABNORMAL LOW (ref 8.5–10.1)
Co2: 34 mmol/L — ABNORMAL HIGH (ref 21–32)
EGFR (African American): 57 — ABNORMAL LOW
EGFR (Non-African Amer.): 49 — ABNORMAL LOW
GLUCOSE: 172 mg/dL — AB (ref 65–99)
OSMOLALITY: 285 (ref 275–301)
POTASSIUM: 4.8 mmol/L (ref 3.5–5.1)
SODIUM: 139 mmol/L (ref 136–145)

## 2013-09-27 ENCOUNTER — Inpatient Hospital Stay: Payer: Self-pay | Admitting: Internal Medicine

## 2013-09-27 LAB — CBC
HCT: 27.9 % — ABNORMAL LOW (ref 40.0–52.0)
HGB: 8.8 g/dL — ABNORMAL LOW (ref 13.0–18.0)
MCH: 26.5 pg (ref 26.0–34.0)
MCHC: 31.6 g/dL — ABNORMAL LOW (ref 32.0–36.0)
MCV: 84 fL (ref 80–100)
Platelet: 97 10*3/uL — ABNORMAL LOW (ref 150–440)
RBC: 3.32 10*6/uL — ABNORMAL LOW (ref 4.40–5.90)
RDW: 18.3 % — ABNORMAL HIGH (ref 11.5–14.5)
WBC: 7.1 10*3/uL (ref 3.8–10.6)

## 2013-09-27 LAB — URINALYSIS, COMPLETE
BACTERIA: NONE SEEN
Bilirubin,UR: NEGATIVE
GLUCOSE, UR: NEGATIVE mg/dL (ref 0–75)
Ketone: NEGATIVE
Leukocyte Esterase: NEGATIVE
Nitrite: NEGATIVE
Ph: 5 (ref 4.5–8.0)
Protein: >=500
RBC,UR: 112 /HPF (ref 0–5)
Specific Gravity: 1.016 (ref 1.003–1.030)
Squamous Epithelial: NONE SEEN

## 2013-09-27 LAB — BASIC METABOLIC PANEL
ANION GAP: 0 — AB (ref 7–16)
BUN: 23 mg/dL — ABNORMAL HIGH (ref 7–18)
CALCIUM: 8.6 mg/dL (ref 8.5–10.1)
CHLORIDE: 103 mmol/L (ref 98–107)
CO2: 37 mmol/L — AB (ref 21–32)
Creatinine: 1.34 mg/dL — ABNORMAL HIGH (ref 0.60–1.30)
EGFR (African American): >60
EGFR (Non-African Amer.): 54 — ABNORMAL LOW
GLUCOSE: 60 mg/dL — AB (ref 65–99)
OSMOLALITY: 281 (ref 275–301)
Potassium: 4.4 mmol/L (ref 3.5–5.1)
Sodium: 140 mmol/L (ref 136–145)

## 2013-09-27 LAB — TROPONIN I: Troponin-I: <0.02 ng/mL

## 2013-09-27 LAB — PRO B NATRIURETIC PEPTIDE: B-Type Natriuretic Peptide: 3133 pg/mL — ABNORMAL HIGH (ref 0–125)

## 2013-09-28 LAB — COMPREHENSIVE METABOLIC PANEL
Albumin: 2.7 g/dL — ABNORMAL LOW (ref 3.4–5.0)
Alkaline Phosphatase: 104 U/L
Anion Gap: 4 — ABNORMAL LOW (ref 7–16)
BILIRUBIN TOTAL: 0.7 mg/dL (ref 0.2–1.0)
BUN: 28 mg/dL — AB (ref 7–18)
Calcium, Total: 8.9 mg/dL (ref 8.5–10.1)
Chloride: 101 mmol/L (ref 98–107)
Co2: 32 mmol/L (ref 21–32)
Creatinine: 1.37 mg/dL — ABNORMAL HIGH (ref 0.60–1.30)
EGFR (African American): >60
GFR CALC NON AF AMER: 53 — AB
Glucose: 267 mg/dL — ABNORMAL HIGH (ref 65–99)
Osmolality: 289 (ref 275–301)
Potassium: 5 mmol/L (ref 3.5–5.1)
SGOT(AST): 25 U/L (ref 15–37)
SGPT (ALT): 41 U/L (ref 12–78)
SODIUM: 137 mmol/L (ref 136–145)
Total Protein: 6.9 g/dL (ref 6.4–8.2)

## 2013-09-28 LAB — TROPONIN I: Troponin-I: <0.02 ng/mL

## 2013-09-28 LAB — CBC WITH DIFFERENTIAL/PLATELET
Basophil #: 0 10*3/uL (ref 0.0–0.1)
Basophil %: 0.1 %
Eosinophil #: 0 10*3/uL (ref 0.0–0.7)
Eosinophil %: 0.1 %
HCT: 28.8 % — AB (ref 40.0–52.0)
HGB: 9.1 g/dL — ABNORMAL LOW (ref 13.0–18.0)
LYMPHS PCT: 7.2 %
Lymphocyte #: 0.4 10*3/uL — ABNORMAL LOW (ref 1.0–3.6)
MCH: 26.5 pg (ref 26.0–34.0)
MCHC: 31.5 g/dL — AB (ref 32.0–36.0)
MCV: 84 fL (ref 80–100)
Monocyte #: 0.2 x10 3/mm (ref 0.2–1.0)
Monocyte %: 3.3 %
NEUTROS ABS: 4.6 10*3/uL (ref 1.4–6.5)
Neutrophil %: 89.3 %
Platelet: 92 10*3/uL — ABNORMAL LOW (ref 150–440)
RBC: 3.42 10*6/uL — ABNORMAL LOW (ref 4.40–5.90)
RDW: 18.5 % — AB (ref 11.5–14.5)
WBC: 5.1 10*3/uL (ref 3.8–10.6)

## 2013-09-28 LAB — DRUG SCREEN, URINE
Amphetamines, Ur Screen: NEGATIVE (ref ?–1000)
BENZODIAZEPINE, UR SCRN: NEGATIVE (ref ?–200)
Barbiturates, Ur Screen: NEGATIVE (ref ?–200)
CANNABINOID 50 NG, UR ~~LOC~~: NEGATIVE (ref ?–50)
Cocaine Metabolite,Ur ~~LOC~~: NEGATIVE (ref ?–300)
MDMA (Ecstasy)Ur Screen: POSITIVE (ref ?–500)
METHADONE, UR SCREEN: NEGATIVE (ref ?–300)
Opiate, Ur Screen: NEGATIVE (ref ?–300)
Phencyclidine (PCP) Ur S: NEGATIVE (ref ?–25)
TRICYCLIC, UR SCREEN: NEGATIVE (ref ?–1000)

## 2013-09-28 LAB — PRO B NATRIURETIC PEPTIDE: B-Type Natriuretic Peptide: 4651 pg/mL — ABNORMAL HIGH (ref 0–125)

## 2013-09-28 LAB — CLOSTRIDIUM DIFFICILE(ARMC)

## 2013-09-28 LAB — OCCULT BLOOD X 1 CARD TO LAB, STOOL: Occult Blood, Feces: POSITIVE

## 2013-09-29 ENCOUNTER — Ambulatory Visit: Payer: Self-pay | Admitting: Internal Medicine

## 2013-09-29 LAB — CBC WITH DIFFERENTIAL/PLATELET
BASOS ABS: 0 10*3/uL (ref 0.0–0.1)
Basophil %: 0.4 %
Eosinophil #: 0 10*3/uL (ref 0.0–0.7)
Eosinophil %: 0.5 %
HCT: 24.9 % — ABNORMAL LOW (ref 40.0–52.0)
HGB: 8 g/dL — ABNORMAL LOW (ref 13.0–18.0)
Lymphocyte #: 1 10*3/uL (ref 1.0–3.6)
Lymphocyte %: 16.7 %
MCH: 26.5 pg (ref 26.0–34.0)
MCHC: 31.9 g/dL — ABNORMAL LOW (ref 32.0–36.0)
MCV: 83 fL (ref 80–100)
MONOS PCT: 10.4 %
Monocyte #: 0.7 x10 3/mm (ref 0.2–1.0)
NEUTROS ABS: 4.5 10*3/uL (ref 1.4–6.5)
NEUTROS PCT: 72 %
Platelet: 88 10*3/uL — ABNORMAL LOW (ref 150–440)
RBC: 3.01 10*6/uL — ABNORMAL LOW (ref 4.40–5.90)
RDW: 18.4 % — ABNORMAL HIGH (ref 11.5–14.5)
WBC: 6.3 10*3/uL (ref 3.8–10.6)

## 2013-09-29 LAB — BASIC METABOLIC PANEL
ANION GAP: 3 — AB (ref 7–16)
BUN: 33 mg/dL — ABNORMAL HIGH (ref 7–18)
CHLORIDE: 100 mmol/L (ref 98–107)
CREATININE: 1.17 mg/dL (ref 0.60–1.30)
Calcium, Total: 8.9 mg/dL (ref 8.5–10.1)
Co2: 36 mmol/L — ABNORMAL HIGH (ref 21–32)
EGFR (African American): >60
EGFR (Non-African Amer.): >60
GLUCOSE: 51 mg/dL — AB (ref 65–99)
Osmolality: 282 (ref 275–301)
POTASSIUM: 4.1 mmol/L (ref 3.5–5.1)
SODIUM: 139 mmol/L (ref 136–145)

## 2013-09-30 LAB — CBC WITH DIFFERENTIAL/PLATELET
Basophil #: 0 10*3/uL (ref 0.0–0.1)
Basophil %: 0.7 %
EOS ABS: 0.1 10*3/uL (ref 0.0–0.7)
Eosinophil %: 1.3 %
HCT: 27.2 % — AB (ref 40.0–52.0)
HGB: 8.5 g/dL — ABNORMAL LOW (ref 13.0–18.0)
Lymphocyte #: 0.9 10*3/uL — ABNORMAL LOW (ref 1.0–3.6)
Lymphocyte %: 18.2 %
MCH: 25.8 pg — AB (ref 26.0–34.0)
MCHC: 31.3 g/dL — ABNORMAL LOW (ref 32.0–36.0)
MCV: 83 fL (ref 80–100)
MONO ABS: 0.4 x10 3/mm (ref 0.2–1.0)
Monocyte %: 8 %
NEUTROS PCT: 71.8 %
Neutrophil #: 3.6 10*3/uL (ref 1.4–6.5)
Platelet: 99 10*3/uL — ABNORMAL LOW (ref 150–440)
RBC: 3.3 10*6/uL — ABNORMAL LOW (ref 4.40–5.90)
RDW: 19 % — ABNORMAL HIGH (ref 11.5–14.5)
WBC: 5 10*3/uL (ref 3.8–10.6)

## 2013-09-30 LAB — BASIC METABOLIC PANEL
Anion Gap: 3 — ABNORMAL LOW (ref 7–16)
BUN: 22 mg/dL — ABNORMAL HIGH (ref 7–18)
Calcium, Total: 9.3 mg/dL (ref 8.5–10.1)
Chloride: 95 mmol/L — ABNORMAL LOW (ref 98–107)
Co2: 38 mmol/L — ABNORMAL HIGH (ref 21–32)
Creatinine: 1.28 mg/dL (ref 0.60–1.30)
EGFR (African American): >60
EGFR (Non-African Amer.): 58 — ABNORMAL LOW
Glucose: 62 mg/dL — ABNORMAL LOW (ref 65–99)
OSMOLALITY: 273 (ref 275–301)
POTASSIUM: 4.2 mmol/L (ref 3.5–5.1)
Sodium: 136 mmol/L (ref 136–145)

## 2013-10-01 LAB — BASIC METABOLIC PANEL
Anion Gap: 5 — ABNORMAL LOW (ref 7–16)
BUN: 20 mg/dL — ABNORMAL HIGH (ref 7–18)
CALCIUM: 9.4 mg/dL (ref 8.5–10.1)
Chloride: 97 mmol/L — ABNORMAL LOW (ref 98–107)
Co2: 37 mmol/L — ABNORMAL HIGH (ref 21–32)
Creatinine: 1.47 mg/dL — ABNORMAL HIGH (ref 0.60–1.30)
GFR CALC AF AMER: 56 — AB
GFR CALC NON AF AMER: 49 — AB
GLUCOSE: 34 mg/dL — AB (ref 65–99)
Osmolality: 277 (ref 275–301)
Potassium: 4 mmol/L (ref 3.5–5.1)
Sodium: 139 mmol/L (ref 136–145)

## 2013-10-01 LAB — PLATELET COUNT: Platelet: 152 10*3/uL (ref 150–440)

## 2013-10-01 LAB — VANCOMYCIN, TROUGH
VANCOMYCIN, TROUGH: 23 ug/mL — AB (ref 10–20)
Vancomycin, Trough: 28 ug/mL (ref 10–20)

## 2013-10-02 LAB — BASIC METABOLIC PANEL
ANION GAP: 3 — AB (ref 7–16)
BUN: 17 mg/dL (ref 7–18)
CHLORIDE: 99 mmol/L (ref 98–107)
CO2: 36 mmol/L — AB (ref 21–32)
Calcium, Total: 8.6 mg/dL (ref 8.5–10.1)
Creatinine: 1.36 mg/dL — ABNORMAL HIGH (ref 0.60–1.30)
EGFR (Non-African Amer.): 53 — ABNORMAL LOW
Glucose: 129 mg/dL — ABNORMAL HIGH (ref 65–99)
Osmolality: 279 (ref 275–301)
POTASSIUM: 4.2 mmol/L (ref 3.5–5.1)
Sodium: 138 mmol/L (ref 136–145)

## 2013-10-02 LAB — CULTURE, BLOOD (SINGLE)

## 2013-10-09 ENCOUNTER — Other Ambulatory Visit: Payer: Self-pay | Admitting: Family

## 2013-10-09 LAB — BASIC METABOLIC PANEL
ANION GAP: 2 — AB (ref 7–16)
BUN: 34 mg/dL — AB (ref 7–18)
CHLORIDE: 101 mmol/L (ref 98–107)
CO2: 32 mmol/L (ref 21–32)
Calcium, Total: 7.7 mg/dL — ABNORMAL LOW (ref 8.5–10.1)
Creatinine: 1.54 mg/dL — ABNORMAL HIGH (ref 0.60–1.30)
EGFR (African American): 53 — ABNORMAL LOW
EGFR (Non-African Amer.): 46 — ABNORMAL LOW
GLUCOSE: 183 mg/dL — AB (ref 65–99)
Osmolality: 282 (ref 275–301)
POTASSIUM: 5.1 mmol/L (ref 3.5–5.1)
SODIUM: 135 mmol/L — AB (ref 136–145)

## 2013-10-09 LAB — PROTIME-INR
INR: 1.1
Prothrombin Time: 13.9 secs (ref 11.5–14.7)

## 2013-10-29 ENCOUNTER — Ambulatory Visit: Payer: Self-pay | Admitting: Internal Medicine

## 2013-11-29 DEATH — deceased

## 2014-08-27 IMAGING — CR DG CHEST 2V
1 series · 2 of 2 positions shown · non-contrast
Comparison: August 30, 2013

CLINICAL DATA: Congestive heart failure with shortness of breath

EXAM:
CHEST  2 VIEW

[Series 1: w chest pa · 0.14mm/px · 2 of 2 slices shown]
[im 1/2]
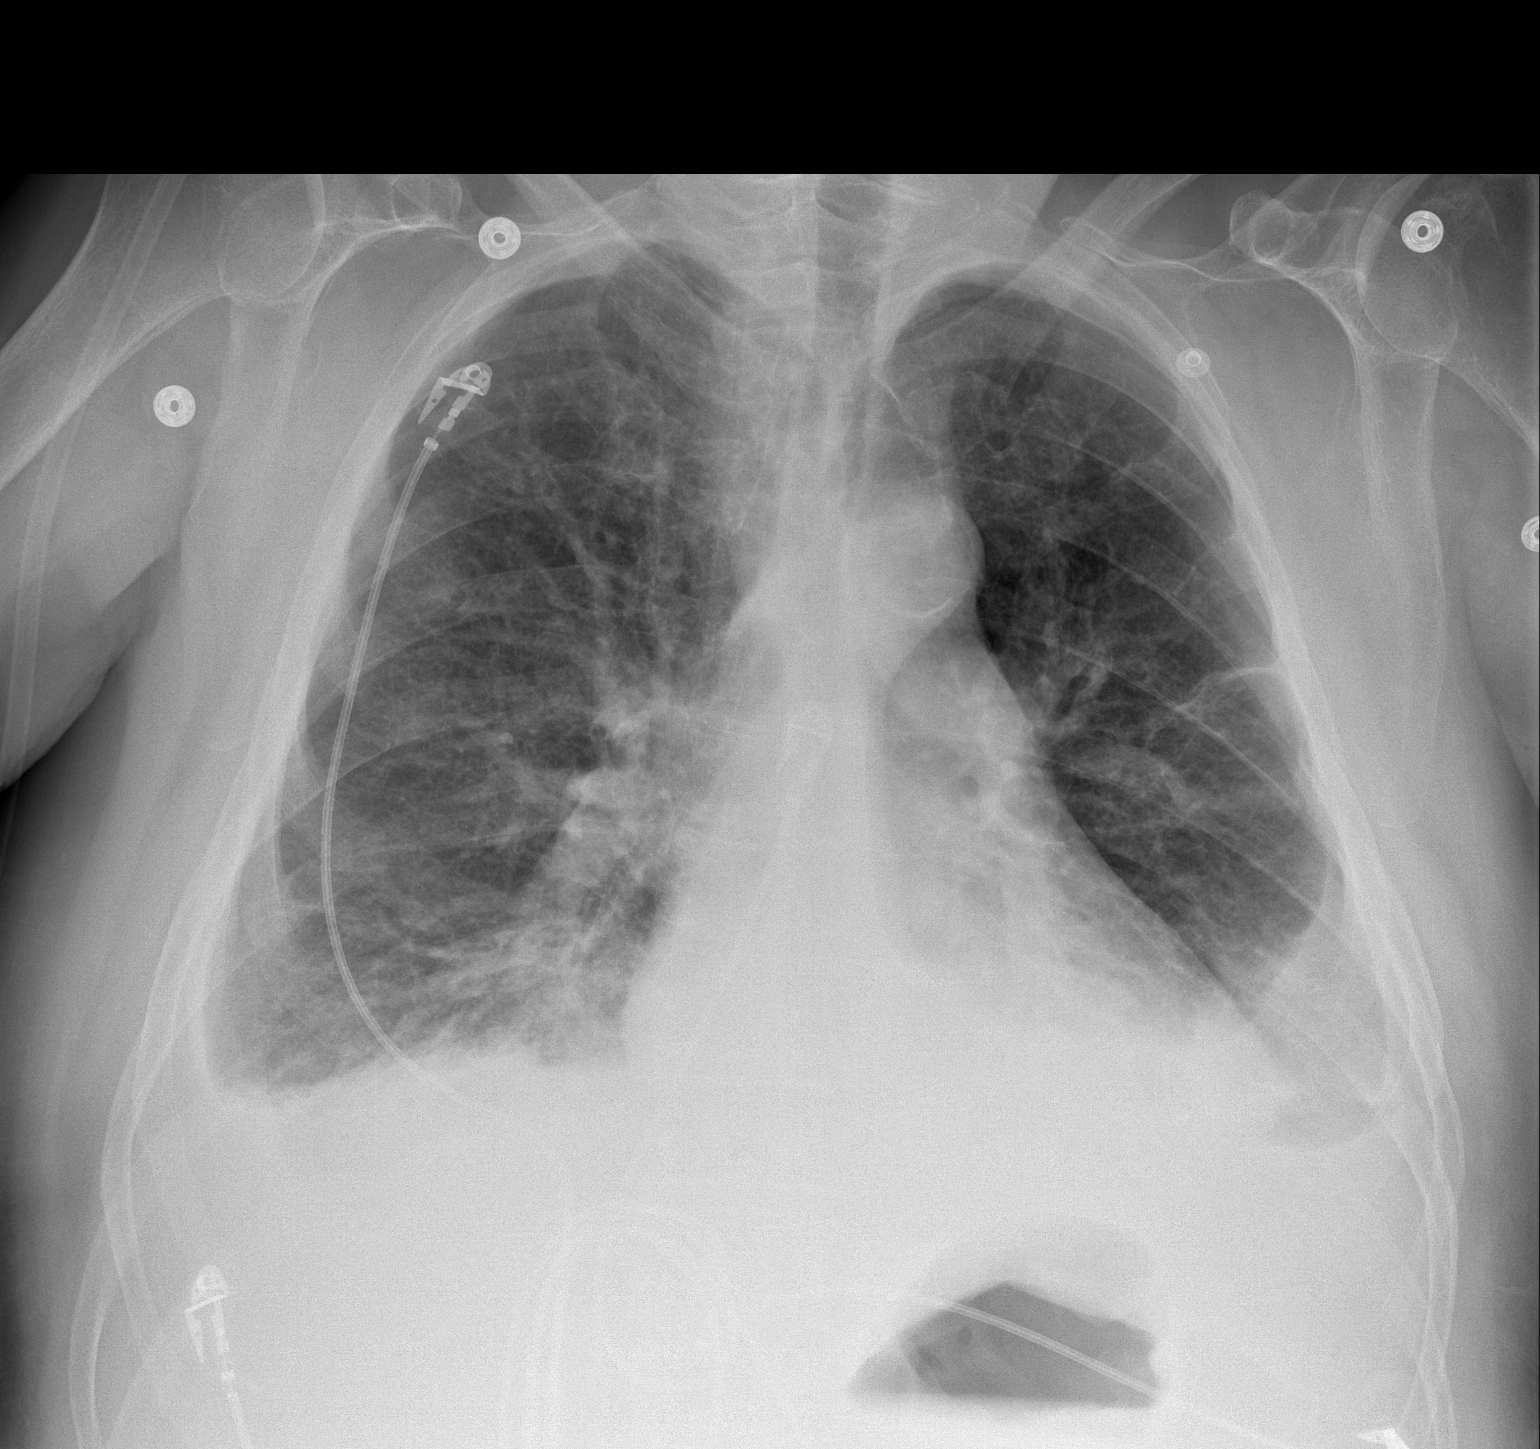
[im 2/2]
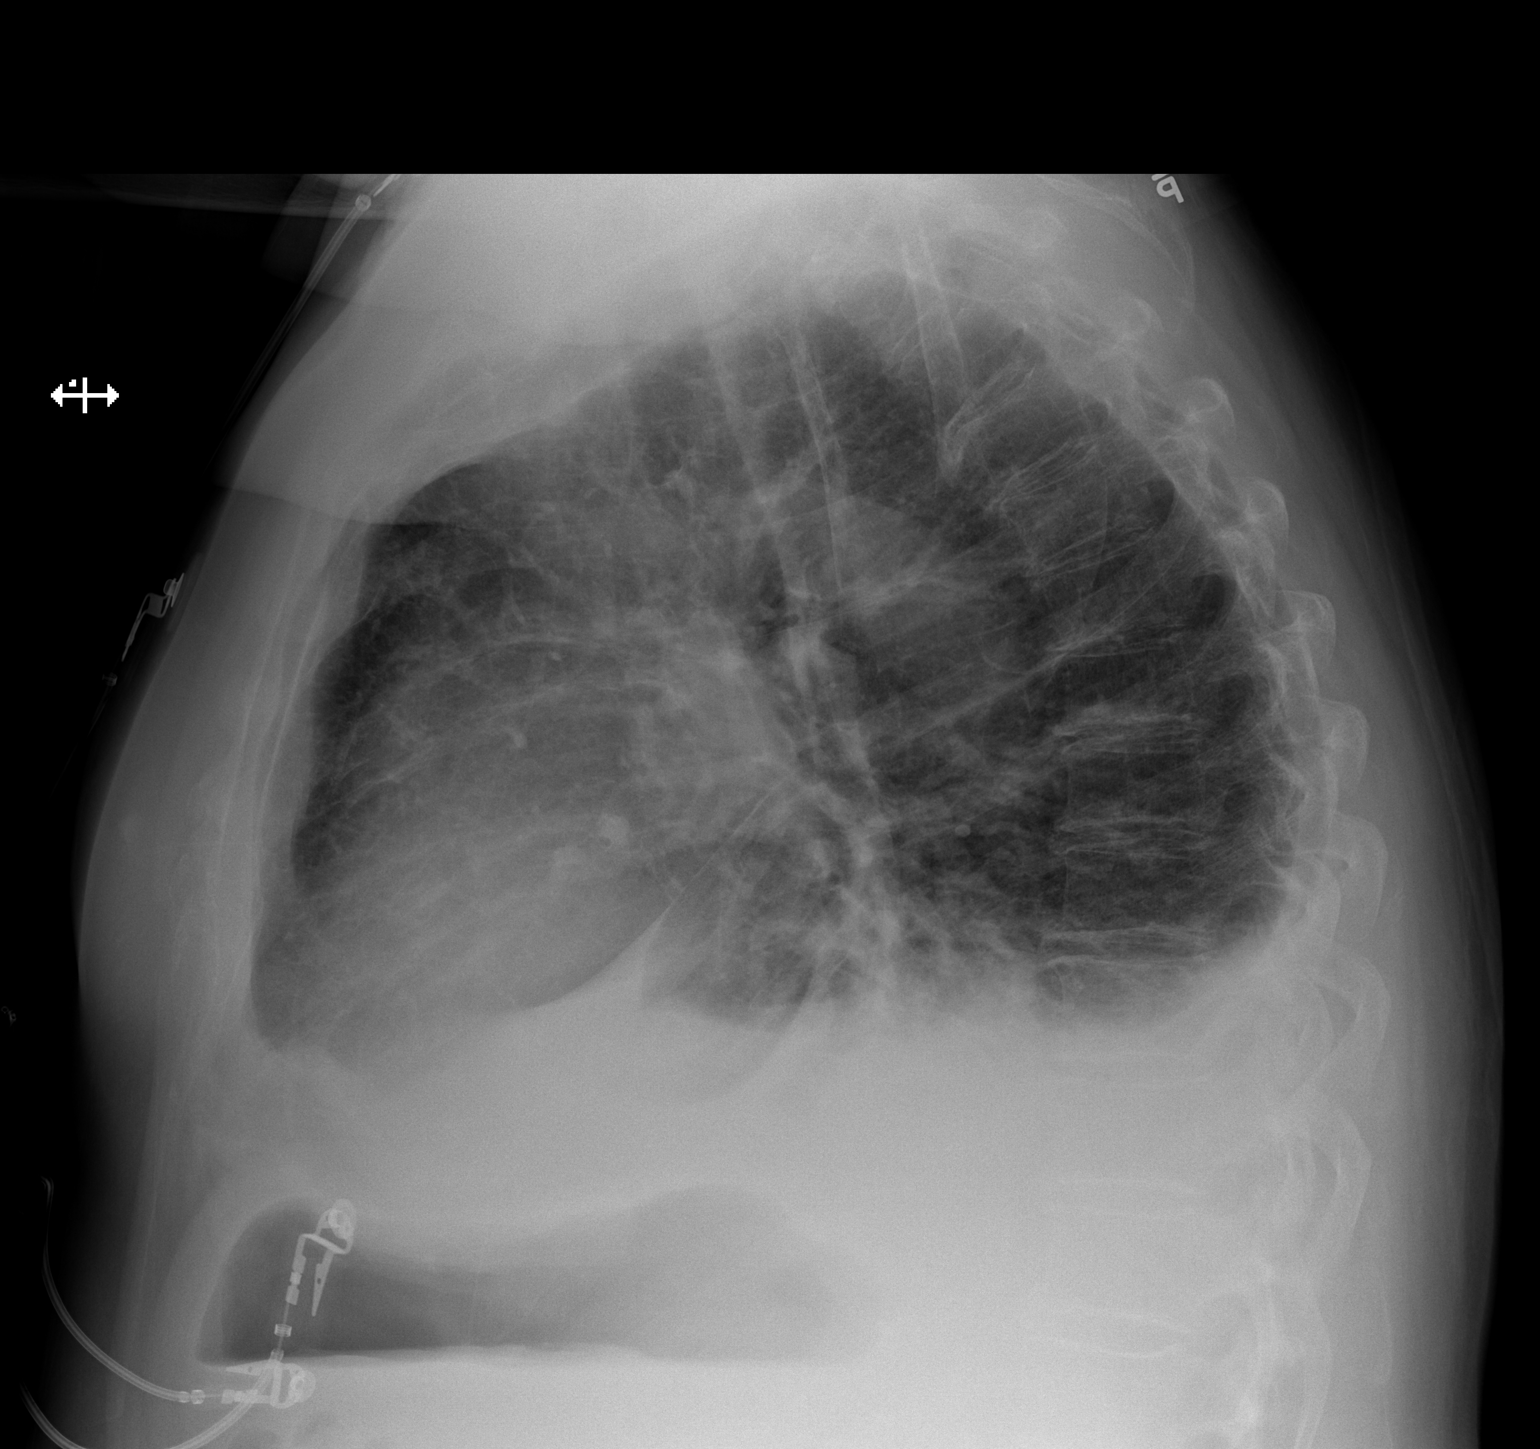

[2 of 2 positions shown; findings below may reference images not displayed]

FINDINGS: There is persistent cardiomegaly with bilateral effusions and
interstitial edema, primarily in the mid and lower lung zones. There
is pulmonary venous hypertension. There is mild airspace
consolidation in the left base. No adenopathy. No bone lesions.
IMPRESSION: Persistent congestive heart failure. A degree of superimposed
pneumonia in the left base cannot be excluded radiographically.

## 2014-09-13 IMAGING — CR DG CHEST 1V PORT
1 series · 1 of 1 positions shown · non-contrast
Comparison: DG CHEST 2V dated 09/10/2013; DG CHEST 1V PORT dated
08/30/2013; DG CHEST 1V dated 08/19/2013

CLINICAL DATA: Shortness of breath, CHF and COPD.

EXAM:
PORTABLE CHEST - 1 VIEW

[ap]
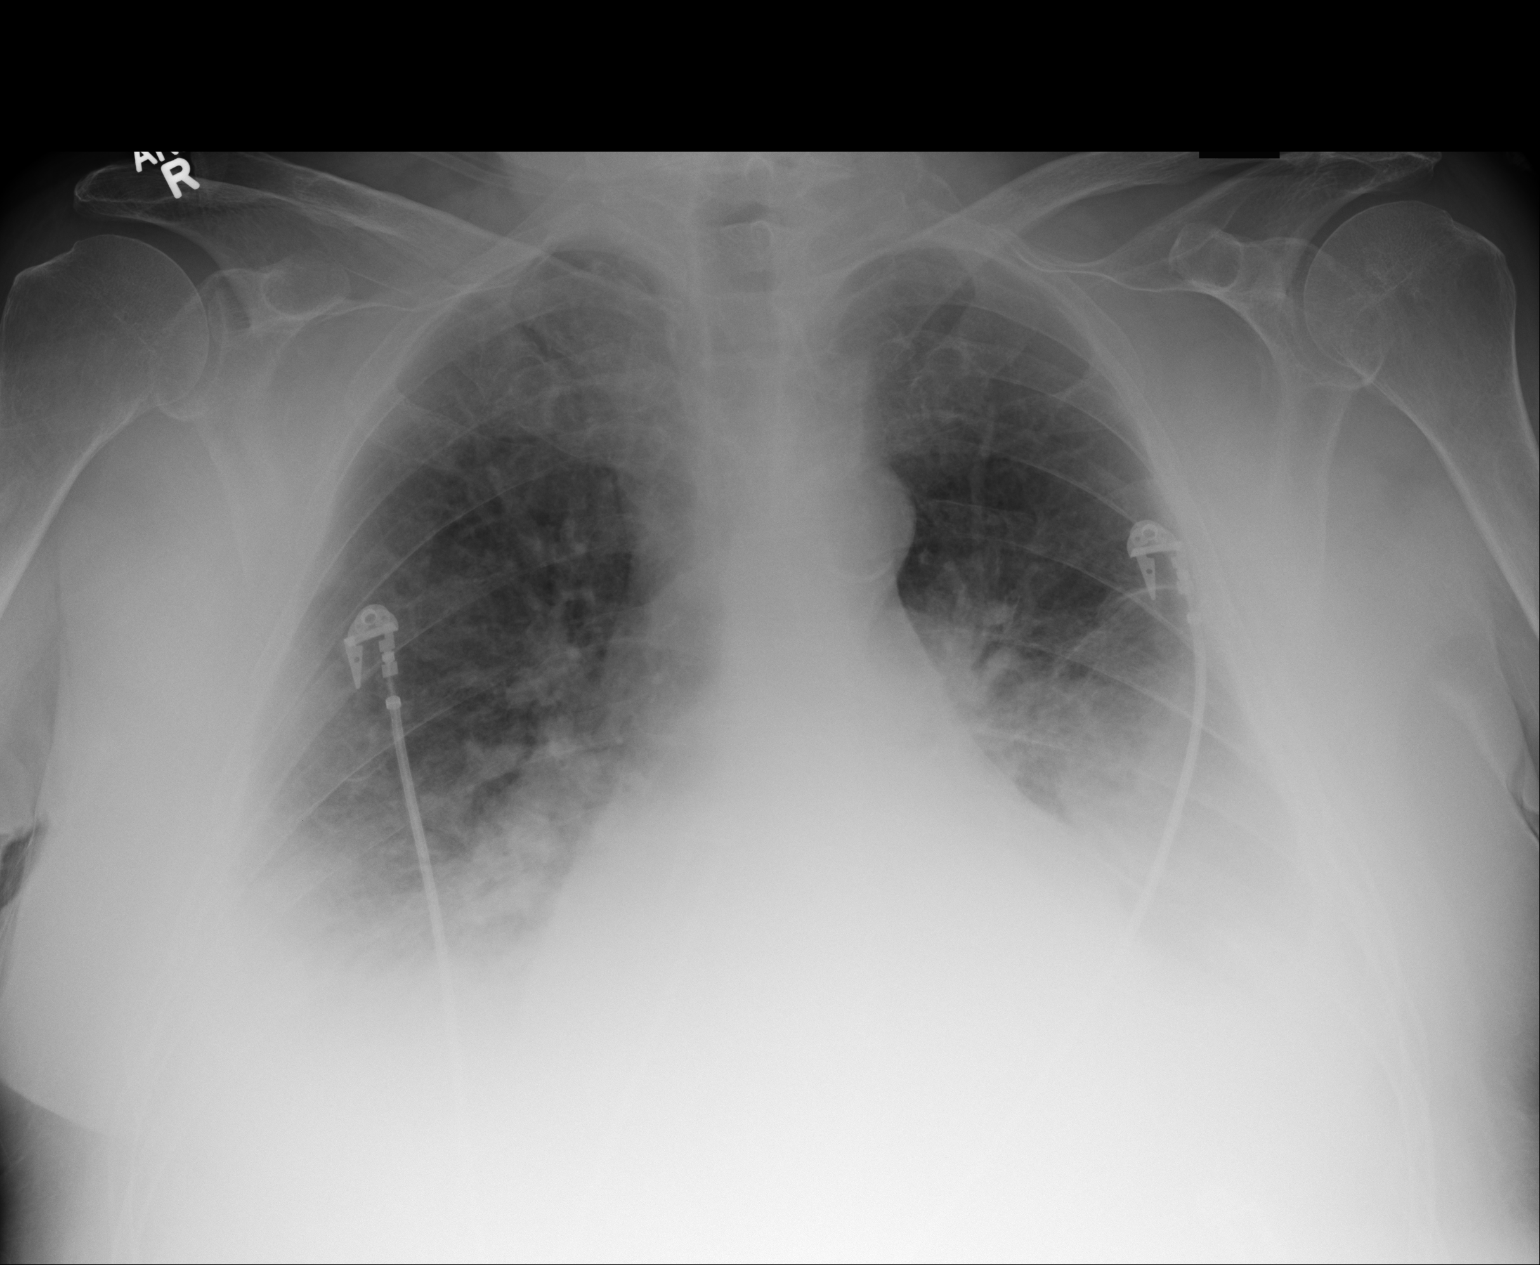

[1 of 1 positions shown; findings below may reference images not displayed]

FINDINGS: Relatively stable appearance of interstitial edema with bilateral
pleural fluid and mild cardiomegaly. Edematous changes are largely
chronic.
IMPRESSION: Chronic appearance of interstitial edema with bilateral pleural
effusions.

## 2014-10-21 NOTE — Discharge Summary (Signed)
PATIENT NAME:  Bruce Parsons, Bruce Parsons MR#:  [id number] DATE OF BIRTH:  Sep 26, 1945  DATE OF ADMISSION:  07/03/2012 DATE OF DISCHARGE:  07/08/2012  ADMITTING PHYSICIAN: Ramonita Lab, MD  DISCHARGING PHYSICIAN: Enid Baas, MD  PRIMARY CARE PHYSICIAN: Lyndon Code, MD  CONSULTATIONS IN THE HOSPITAL: Urology consultation by Suszanne Conners. Assunta Found, MD  DISCHARGE DIAGNOSES: 1. Metabolic encephalopathy.  2. Fall on admission from underlying infection.  3. Sepsis from methicillin-sensitive Staph aureus urinary tract infection.  4. Methicillin-sensitive Staph aureus urinary tract infection and also bacteremia. 5. Urinary retention.  6. Bilateral moderate hydronephrosis without any obstruction secondary to urinary retention.  7. Hypertension.  8. Uncontrolled diabetes mellitus with hemoglobin A1c of 12.  9. Chronic obstructive pulmonary disease with acute bronchitis.  10. 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  11. History of coronary artery disease status post stent. 12. Right hip pain from fall and muscle injury, negative for any fractures.   DISCHARGE MEDICATIONS:  1. Plavix 75 mg p.o. daily.  2. Atorvastatin 20 mg p.o. daily.  3. Vitamin D2 50,000 International Units one capsule once a week for 12 weeks.  4. Glimepiride 4 mg p.o. b.i.d.  5. Advair 500/50 one puff b.i.d. 6. Spiriva inhalation one capsule inhaled daily.  7. Aspirin 81 mg p.o. daily.  8. Lyrica 75 mg p.o. b.i.d.  9. Zolpidem 10 mg at bedtime once a day.  10. Metoprolol 25 mg p.o. b.i.d.  11. DuoNebs with albuterol and ipratropium 3 mL, one dose inhaled every 6 hours as needed for shortness of breath.  12. Magnesium oxide 400 mg p.o. b.i.d.  13. Lantus 60 units subcutaneous in the morning and 25 units subcutaneous at bedtime.  14. Tramadol 50 mg q.6 hours p.r.n.  15. Flomax 0.4 mg p.o. daily.  16. Triamcinolone Nystatin cream 0.1% application topically every 8 hours as needed for fungal  infection.  17. Levaquin 500 mg p.o. daily until 07/13/2012.  18. Keflex 500 mg p.o. t.i.d. for 10 more days.  19. Norco 5/325 mg one  tablet every 6 hours as needed for pain.   DISCHARGE HOME OXYGEN: None.   DISCHARGE DIET: Low sodium, ADA diet.   DISCHARGE ACTIVITY: As tolerated.   FOLLOWUP INSTRUCTIONS:  1. PCP followup in 1 to 2 weeks.  2. Follow up with Orthopedics for right and lateral thigh pain if no improvement with pain medications in two weeks for possible trochanteric bursitis.  3. Urology followup for urinary retention in one week.  4. The patient will be having a Foley catheter at this time until seen by urologist as an outpatient.  5. Physical therapy.   LABORATORY DATA AT THE TIME OF DISCHARGE:  1. WBC 10.2, hemoglobin 10.0, hematocrit 30.1, platelet count 153.  2. Sodium 131, potassium 4.1, chloride 98, bicarb 24, BUN 13, creatinine 0.92, glucose of 262 and calcium of 8.3. 3. 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  4. Chest x-ray from 07/06/2012 showing hyperinflation consistent with COPD, acute bronchitis probably in clinical setting.  5. CT of the right hip done. No evidence of any fracture of the proximal femur or bony glenoid the remainder of the visualized portions of right hemipelvis. Soft tissue injury is not appreciated with the CT scan. If further questions, MRI can be ordered.  6. Urine cultures from 07/04/2012 showing greater than 100,000 colonies of Staph aureus pansensitive.  7. Blood cultures from 07/04/2012  showing Staph aureus pansensitive in both the bottles. Repeat blood cultures from 07/06/2012 are negative.  8. Echo Doppler on admission showing borderline right ventricular enlargement, EF of 45 to 50%, trace mitral regurgitation.  9. Ultrasound Dopplers carotid showing no evidence of hemodynamically  significant stenosis. 10. His blood cultures from January 3rd are also positive for MSSA.  11. HbA1c is 11.8. LDL is 39, HDL 32, total cholesterol 94 and triglycerides 114  BRIEF HOSPITAL COURSE: The patient is a 69 year old, disheveled-appearing Caucasian gentleman with multiple medical problems including history of CAD status post stents, CHF, COPD, uncontrolled diabetes mellitus, GERD, hypertension was brought in secondary to confusion and found on the floor. The patient used to take care of his [facility] who [facility] and he has been living in his mobile home. His son was planning to come move in with him to help him. The patient was found to have possible urinary tract infection, so was admitted for metabolic encephalopathy and syncopal episode.   1. Fall and syncope secondary to underlying infection and dehydration. He did have a syncope workup done with CT head, carotid Dopplers and echo which did not show any acute abnormalities. His metabolic encephalopathy improved as his underlying infection was being treated.  2. Sepsis secondary to MSSA bacteremia and also UTI. His blood cultures on January 3rd and January 4th from admission were growing MSSA and also urine cultures were growing MSSA which could probably be the source of his sepsis. He was initially on Rocephin as the cultures were positive for Gram-positive cocci. He did get a couple of days of vancomycin until sensitivities are back. Because it has been in his blood, will do a complete two week course of antibiotics and will treat him with Keflex based on the sensitivities. His repeat blood cultures after antibiotics though from 07/06/2012 have been negative.  3. COPD with acute bronchitis. The patient has been having cough while in the hospital and x-ray showed COPD changes with possible acute bronchitis. He was still spiking fevers, so he was also added with Levaquin for atypical coverage for his  bronchitis and he will finish a 7-day course of that.  4. Insulin-dependent diabetes mellitus has been uncontrolled because of noncompliance with diet and also medications. His HbA1c was 11.8, so his insulin regimen has been adjusted as Lantus 60 units in the morning and 25 units at bedtime with glimepiride 4 mg b.i.d. with appropriate blood sugar control. His fasting was less than 100 at the time of discharge.  5. Urinary retention with bilateral moderate hydronephrosis. The patient has been having troubles with urinary retention. 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 After 72 hours, Foley was taken out and a void trial was done. The patient did void, but he still had about 300 mL residual after post void. He has been on Flomax for 3 days now, so Foley is being reinserted because he might develop more retention and more hydronephrosis and he will follow up with urologist as an outpatient.  6. Peripheral neuropathy. His Lyrica has been continued. 7. Right hip pain and also right lateral thigh pain which is tender to touch, probably from muscle injury after the fall, trochanter bursitis cannot be ruled out. He did have a CT of the hip and pelvis  which did not show any occult fractures, so he is being discharged on pain medications and if he does not improve might need to see Ortho to see if he can get a steroid injection in the trochanter bursa or further intervention.  8. History of coronary artery disease has been stable in the hospital. His aspirin and Plavix have been continued.   His course has been otherwise uneventful in the hospital.   DISCHARGE CONDITION: Stable.   DISCHARGE DISPOSITION: To short-term rehab based on Physical Therapy recommendation. The patient chose [facility].   Time spent on discharge  is 45 minutes.     ____________________________ Enid Baas, MD rk:es D: 07/08/2012 12:50:54 ET T: 07/08/2012 13:29:07 ET JOB#: [id number]  cc: Enid Baas, MD, <Dictator> Lyndon Code, MD Enid Baas MD ELECTRONICALLY SIGNED 07/10/2012 14:31

## 2014-10-21 NOTE — Discharge Summary (Signed)
PATIENT NAME:  Bruce Parsons, Bruce Parsons MR#:  [id number] DATE OF BIRTH:  01/07/1946  DATE OF ADMISSION:  12/17/2012 DATE OF DISCHARGE:  12/18/2012  ADMISSION DIAGNOSIS: Acute renal failure   DISCHARGE DIAGNOSES:   1.  Acute renal failure secondary to dehydration.  2.  Urinary tract infection.  3.  Community-acquired pneumonia. 4.  Hyponatremia.  5.  Weakness from dehydration, pneumonia and urinary tract infection.  6.  Hypomagnesemia.  7.  Chronic thrombocytopenia.   CONSULTATIONS: None.   LABORATORIES AT DISCHARGE: White blood cells 5.9, hemoglobin 10.7, hematocrit 32, platelets of 79. Sodium 134, potassium 4.3, chloride 103, bicarbonate 27, BUN 16, creatinine 0.91, glucose 224, magnesium 1.7. Hemoglobin A1c 7.8. Cholesterol 77, VLDL 26, LDL 36, HDL 15, triglycerides 131. TSH is normal at 2.54. C. difficile is negative. Stool cultures  negative. Urine cultures: Colonies too small to read.   HOSPITAL COURSE: The patient is a 69 year old male who presented with weakness, was found to have a pneumonia and urinary tract infection as well as acute renal failure. For further details, please refer to H and P. 1.  Acute renal failure from dehydration secondary to poor p.o. intake, which improved with IV fluids. Creatinine is back to baseline.  2.  Hyponatremia from dehydration, which improved with IV fluids. 3.  Community-acquired pneumonia: The patient will be on Levaquin for 7 total days of  antibiotic treatment.  4.  UTI: The patient will continue on Levaquin. His urine culture had too many small colonies to read.  5.  Weakness from dehydration, pneumonia, UTI: Physical therapy recommends home with supervision [facility] and [facility] will be arranged through them.  6.  Hypomagnesemia: The patient is on magnesium supplements.   DISCHARGE MEDICATIONS: 1.  Atorvastatin 20 mg daily.  2.  Glimepiride  4 mg b.i.d.  3.  Aspirin 81 mg daily.  4.   Metoprolol 25 b.i.d.  5.  Magnesium oxide 400 mg b.i.d.  6.  Tamsulosin 0.4 mg daily.  7.  Plavix 75 mg daily.  8.  Lyrica 150 mg b.i.d.  9.  Singulair 10 mg daily.  10.  Trazodone 50 mg at bedtime.  11.  Metformin 500 mg b.i.d.  12.  Norvasc 2.5 daily.  13.  Lantus 60 units at bedtime.  14.  NovoLog 10 units daily.  15.  Levaquin 750 mg daily for 6 days.   DISCHARGE DIET: Low-sodium, ADA diet.   DISCHARGE ACTIVITY: As tolerated.  DISCHARGE REFERRAL: Physical therapy will be arranged through [facility].   DISCHARGE FOLLOWUP: The patient will follow up with Beverely RisenFozia Khan in 1 to 2 weeks.   TIME SPENT: Approximately 35 minutes. The patient is medically stable for discharge.   ____________________________ Tyheim Vanalstyne P. Juliene PinaMody, MD spm:jm D: 12/18/2012 13:53:19 ET T: 12/18/2012 16:42:25 ET JOB#: [id number]  cc: Kemba Hoppes P. Juliene PinaMody, MD, <Dictator> Lyndon CodeFozia M. Khan, MD Dyane Broberg P Xavius Spadafore MD ELECTRONICALLY SIGNED 12/18/2012 21:06

## 2014-10-21 NOTE — H&P (Signed)
PATIENT NAME:  Bruce Parsons, Bruce Parsons MR#:  [id number] DATE OF BIRTH:  [birth year]  DATE OF ADMISSION:  12/17/2012  PRIMARY CARE PHYSICIAN: Dr. Beverely RisenFozia Khan.  CHIEF COMPLAINT: Frequent falls and weakness.   HISTORY OF PRESENT ILLNESS: The patient is a 69 year old Caucasian male with a history of hypertension, diabetes, CAD and COPD, who presented the ED with frequent falls for several days with weakness. The patient is alert, awake, oriented in no acute distress. The patient said that he feels weak. He has had frequent falls for several days. In addition, he has poor appetite, almost no oral intake for the past 1 week. The patient feels thirsty, but denies any fever or chills. He has some headache, but no dizziness. No chest pain, palpitation, orthopnea or nocturnal dyspnea. The patient complains of cough, but no sputum. Chest x-ray showed pneumonia and labs show dehydration. The patient was admitted for dehydration by the ED physician.   PAST MEDICAL HISTORY: As mentioned above, COPD, hypertension, diabetes, hyperlipidemia, CAD, status post stent, history of depression and Barrett's esophagitis.  FAMILY HISTORY:  Father had a heart attack. Hypertension runs in his family.   PAST SURGICAL HISTORY: Hernia repair, right foot surgery, cholecystectomy and cardiac cath in 2009.   SOCIAL HISTORY: The patient is living alone. He has been smoking since a teenager. He used to smoke 3 packs a day, but recently decreased to 1 pack a day. Denies any drinking or illicit drugs.   ALLERGIES: CODEINE.   HOME MEDICATIONS: Trazodone 50 mg p.o. at bedtime, Flomax 0.4 mg p.o. daily, Singulair 1 mg p.o. at bedtime, Plavix 75 mg p.o. daily, NovoLog 10 units subcutaneous once a day, Lopressor 25 mg p.o. b.i.d., metformin 500 mg p.o. b.i.d., magnesium oxide 400 mg p.o. b.i.d., Lyrica 150 mg p.o. twice a day, Lantus 60 units subcutaneous once at bedtime, glimepiride 4 mg p.o. b.i.d., atorvastatin 20 mg p.o. at bedtime, aspirin 81 mg  p.o. daily and norvasc 2.5 mg p.o. daily.   REVIEW OF SYSTEMS:  CONSTITUTIONAL: The patient denies any fever or chills, but has a headache, weakness, poor appetite and poor oral intake. EYES: No double vision or blurred vision. ENT: No postnasal drip, slurred speech or dysphagia. CARDIOVASCULAR: No chest pain, palpitation, orthopnea or nocturnal dyspnea. No leg edema. PULMONARY: Positive for cough, but no sputum, shortness of breath or hemoptysis. GASTROINTESTINAL: No nausea, vomiting, or diarrhea. No melena or bloody stool. GENITOURINARY: No dysuria, hematuria or incontinence. SKIN: No rash or jaundice. NEUROLOGY: No syncope, loss of consciousness or seizure, but has frequent falls and weakness. HEMATOLOGY: No easy bruising or bleeding. ENDOCRINE: No polyuria, polydipsia, heat or cold intolerance.   PHYSICAL EXAMINATION:  VITAL SIGNS: Temperature 98.3, blood pressure 139/72, pulse 87, respirations 20, oxygen saturation 96% on room air.  GENERAL: The patient is alert, awake, oriented, in no acute distress.  HEENT: Pupils round, equal and reactive to light and accommodation. Dry oral mucosa. Clear oropharynx.  NECK: Supple. No JVD or carotid bruit. No lymphadenopathy. No thyromegaly.  CARDIOVASCULAR: S1, S2 regular rate, rhythm. No murmurs or gallops.  PULMONARY: Bilateral air entry. Mild wheezing. There are crackles on the right base. No use of accessory muscle to breathe.  ABDOMEN: Soft. No distention. No tenderness. No organomegaly. Bowel sounds present.  EXTREMITIES: Bilateral leg edema 1+. No clubbing or cyanosis. No calf tenderness. Strong pedal pulses.  SKIN: No rash or jaundice.  NEUROLOGIC: Alert and oriented x3. No focal deficit. Power 4 over 5. Sensation intact.   LABORATORY DATA:  Urinalysis shows nitrite positive, WBC 859, leukocyte esterase 3+. CAT scan of the head: No acute intracranial process. CAT scan of the cervical spine: No cervical spine fracture. Chest x-ray showed probably  underlying pneumonia. CK 456, CK-MB 3. Glucose 275, BUN 32, creatinine 1.45, sodium 130, potassium 3.4, chloride 96, bicarbonate 25, bilirubin 1.4. WBC 7.5, hemoglobin 12.2 and platelets 89. Troponin less than 0.02. Alcohol level less than 3. EKG showed normal sinus rhythm at 89 with PVCs and prolonged QT.   IMPRESSIONS:  1.  Urinary tract infection.  2.  Pneumonia.  3.  Acute renal failure.  4.  Hyponatremia.  5.  Hypokalemia.  6.  Recurrent falls.  7.  Weakness.  8.  Anemia.  9.  Thrombocytopenia.  10. History of chronic obstructive pulmonary disease, hypertension, diabetes, hyperlipidemia and coronary artery disease.   PLAN OF TREATMENT: The patient will be admitted to the medical floor. We will start Levaquin and nebulizer p.Parsons.n.. Follow up CBC, urine culture and sputum culture. For acute renal failure, we will give normal saline IV. Follow up BMP. For diabetes, we will start sliding scale. Continue Lantus 60 units at bedtime, but hold glyburide and metformin. We will continue home hypertension medication. We will get a PT consult and fall precautions. Discussed the patient's condition and the plan of treatment with the patient and the patient's family members. The patient wants a limited code. He does not want intubation or ventilator. If he has a cardiac arrest, he wants one time cardiopulmonary resuscitation.   TIME SPENT: About 66 minutes.   ____________________________ Shaune Pollack, MD qc:aw D: 12/17/2012 13:27:02 ET T: 12/17/2012 13:53:17 ET JOB#: [id number]  cc: Shaune Pollack, MD, <Dictator> Shaune Pollack MD ELECTRONICALLY SIGNED 12/19/2012 13:33

## 2014-10-21 NOTE — H&P (Signed)
PATIENT NAME:  Bruce Parsons, [name] MR#:  [id number] DATE OF BIRTH:  [birth year]  DATE OF ADMISSION:  07/04/2012  PRIMARY CARE PHYSICIAN:  Dr. Beverely RisenFozia Khan.    REFERRING PHYSICIAN:  Dr. Clemens Catholicagsdale.   CHIEF COMPLAINT:  Altered mental status.   HISTORY OF PRESENT ILLNESS:  The patient is a 69 year old Caucasian male with a past medical history of coronary artery disease, COPD, insulin-dependent diabetes mellitus, hypertension, history of depression and Barrett's esophagitis, sent to the ER after he was found by the family on the floor.  According to the ER staff, patient was falling very often today and he was found on the floor by family members and EMS was called and the patient was sent over to the ER.  No family members came with him to the ER and ER staff tried to reach all the family members with the available phone numbers, but were not successful.  The patient was very lethargic and was not able to get any history from the patient.  The patient opens his eyes to his name, but immediately falling asleep.  The patient was found to be hyperglycemic with an initial Accu-Chek of 473, but eventually it dropped down to 408.  The patient's initial CAT scan of the head was negative with no acute findings and chest x-ray was also with no acute findings.  UA was positive for 2+ blood and 3+ leukocyte esterase.  Nitrite was negative.  WBC clumps were present.  The patient's white count is elevated at 15.7 and lactic acid is also high at 1.40.  The story is obtained from the ER staff and old medical records.  The patient has received 1 liter fluid bolus and IV Rocephin for urinary tract infection by the ER physician.  I was able to get only one answer from patient.  He said that he fell 5 times yesterday and then immediately fell asleep.  As patient is quite lethargic, I do not know how far I can rely on his story.   PAST MEDICAL HISTORY:  From old medical records, COPD, hypertension, type 2 diabetes mellitus  insulin-dependent, hyperlipidemia, coronary artery disease status post stent placement in 2008 with 80% complete occlusion and cardiac catheterization and had drug-eluting stent placed.  Repeat cardiac catheter in March 2009 has revealed a patent stent in the left circumflex.  History of depression, history of Barrett's esophagitis status post EGD in 2009.   PAST SURGICAL HISTORY:  Hernia repair, right foot surgery, cholecystectomy, cardiac catheterization in year 2009.  ALLERGIES:  CODEINE.   PSYCHOSOCIAL HISTORY:  Lives at home, h/o tobacco use.  According to old records, no alcohol or illicit drugs.   HOME MEDICATIONS:  Ambien 10 mg by mouth at bedtime, vitamin D 50,000 units 1 capsule p.o. once a week, Spiriva 18 mcg 1 inhalation once daily, pregabalin 75 mg 1 tablet p.o. 2 times a day, Plavix 75 mg once daily, NovoLog 13 units 3 times daily, metoprolol 1 tablet p.o. b.i.d., magnesium oxide 400 p.o. b.i.d., Lantus insulin 60 units subcutaneously once daily, glimepiride 4 mg 2 times daily, atorvastatin 20 mg at bedtime, aspirin 81 mg once daily, amlodipine 2.5 mg once daily, Advair Diskus 500 mcg 1 puff inhalation twice daily.   FAMILY HISTORY:  Diabetes mellitus and hypertension runs in his family from old records.  REVIEW OF SYSTEMS:  Unobtainable as the patient is very lethargic and falling asleep.   PHYSICAL EXAMINATION: VITAL SIGNS:  Temperature 99.5, pulse 123, respiratory rate 18, blood pressure 136/80.  During my examination, pulse dropped down to 110 to 116.  GENERAL APPEARANCE:  Not in any acute distress, moderately built and moderately nourished.  HEENT:  Normocephalic.  Pupils are equally reacting to light and accommodation, 3 mm in size, somewhat sluggishly responding to light.  No conjunctival injection.  No sinus tenderness.  No nasal discharge.  No swelling of the lips.  Tympanic membranes are intact.  NECK:  Supple.  No JVD.  No carotid bruits.  No thyromegaly.  LUNGS:  Clear  to auscultation bilaterally.  No accessory muscle usage.  No anterior chest wall tenderness.   CARDIOVASCULAR:  S1, S2 normal.  Regular rate and rhythm.  Peripheral pulses are 2+.  GASTROINTESTINAL:  Soft.  Bowel sounds are positive in all four quadrants.  Nontender, nondistended.  No masses felt.  NEUROLOGIC:  Arousable, but falling asleep and very lethargic.  I could not complete neuro examination as patient is very lethargic and not following verbal commands.  EXTREMITIES:  No edema.  No cyanosis.  No clubbing.  SKIN:  No rashes.  Several scratch marks were noticed on the right lower extremity.  Right middle finger on the upper extremity with superficial wound which is healing well.  Left upper extremity, a lot of excoriations were noticed.  PSYCHIATRIC:  The patient is being lethargic.  I could not assess his mood and affect. MUSCULOSKELETAL:  No joint effusion.  Erythema is noticed.   LABORATORY AND IMAGING STUDIES:  CT head, no acute findings.  Chest x-ray, no acute findings.  Accu-Chek initially 473, but subsequently at 408, BUN 28, creatinine 1.29, sodium 128, potassium 4.7, chloride 95, CO2 is 26, GFR greater than 60,  calcium 9.1.  Serum alcohol level is less than 3, CK total 47, CPK-MB 1.4.  Troponin T less than 0.02.  WBC 15.7, hemoglobin 11.5, hematocrit 35.0, platelet count 176,000, lactic acid level is elevated at 1.40.  Urinalysis, ketones negative, bilirubin negative, yellow in color,  glucose greater than 500, pH 6.0, blood 2+, protein 30 mg/dL, nitrite negative, leukocyte esterase 3+, WBC clumps are present.   ASSESSMENT AND PLAN:  The patient is a 69 year old male who was sent to the hospital after he was found on the floor by family members. He will be admitted with the following assessment and plan.  1.  Altered mental status, probably from urosepsis.  We will give him IV fluids.  Urine culture and sensitivity was sent to the lab.  IV Rocephin will be continued.  2.  Frequent  fall/syncope.  The patient will be monitored on telemetry.  We will cycle cardiac biomarkers.  PT is consulted to evaluate his gait.  We will get neuro checks.  We will obtain 2-D echocardiogram and carotid Doppler study.  Cycle cardiac biomarkers.  3.  Hyponatremia, which is pseudo in nature from hyperglycemia.  We will give him IV fluids.  4.  Insulin-dependent diabetes mellitus with hyperglycemia.  The patient is lethargic and we are keeping him as nothing by mouth.  We will give him Lantus 10 units for basal coverage and put him on high-dose insulin sliding scale.  5.  History of coronary artery disease.  We will cycle cardiac biomarkers.  The patient has stent placement in the past.  We will continue Plavix.  6.  Chronic obstructive pulmonary disease, chronic in nature and stable at this time.  We will give him DuoNeb treatments as needed basis.  7.  We will provide him gastrointestinal prophylaxis and deep vein thrombosis prophylaxis.  8.  CODE STATUS:  The patient will be FULL CODE until we discuss with the patient or with the family members.   TOTAL TIME SPENT ON ADMISSION:  Sixty minutes.    Rounding physician should consider reaching his family members in a.m.     ____________________________ Ramonita Lab, MD ag:ea D: 07/04/2012 00:36:07 ET T: 07/04/2012 02:45:45 ET JOB#: [id number]  cc: Ramonita Lab, MD, <Dictator> Lyndon Code, MD Ramonita Lab MD ELECTRONICALLY SIGNED 07/08/2012 3:01

## 2014-10-21 NOTE — H&P (Signed)
Subjective/Chief Complaint Urinary retention, urinary tract infection, bilateral hydroureteronephrosis    History of Present Illness 69 yo diabetic male found down at home yesterday.  He was admitted for a presumed diagnosis of urosepsis, as history was unclear due to altered mental status. He now reports having frequent urination, incomplete emptying, and difficulty retracting his foreskin.    Past History DM and associated neuropathy PVD BE    Past Medical Health Diabetes Mellitus   Past Med/Surgical Hx:  COPD:   Hypertension:   Diabetes Mellitus,Type I (IDD):   Asthma:   Gall Bladder Surgery:   ALLERGIES:  Codeine: GI Distress    Medications Started on tamsulosin 0.4 mg daily in hospital   Family and Social History:   Family History Negative    Social History positive ETOH   Review of Systems:   Fever/Chills No    Cough No    Sputum No    Abdominal Pain No    Diarrhea No    Constipation Yes    Nausea/Vomiting No    SOB/DOE No    Chest Pain No    Dysuria Yes    Medications/Allergies Reviewed Medications/Allergies reviewed   Physical Exam:   GEN thin, disheveled, critically ill appearing    HEENT poor dentition    NECK supple    RESP normal resp effort    ABD denies tenderness    GU decreased rectal tone, prostate smooth and non-tender ~30cc    SKIN positive ulcers    PSYCH lethargic   Lab Results: Routine Micro:  03-Jan-14 22:59    Organism Name STAPHYLOCOCCUS AUREUS   Organism Name GRAM POSITIVE COCCI IN CLUSTERS   Micro Text Report BLOOD CULTURE   ORGANISM 1                STAPHYLOCOCCUS AUREUS   COMMENT                   IN AEROBIC AND ANAEROBIC BOTTLES   COMMENT                   SENSITIVITIES TO FOLLOW   GRAM STAIN                GRAM POSITIVE COCCI IN CLUSTERS   ANTIBIOTIC                        Micro Text Report BLOOD CULTURE   ORGANISM 1                GRAM POSITIVE COCCI IN CLUSTERS   COMMENT                   IN  AEROBIC AND ANAEROBIC BOTTLES   COMMENT                   ID TO FOLLOW SENSITIVITIES TO FOLLOW   ANTIBIOTIC                         Organism 1 STAPHYLOCOCCUS AUREUS   Organism 1 GRAM POSITIVE COCCI IN CLUSTERS   Culture Comment IN AEROBIC AND ANAEROBIC BOTTLES   Culture Comment IN AEROBIC AND ANAEROBIC BOTTLES   Culture Comment . SENSITIVITIES TO FOLLOW   Culture Comment . ID TO FOLLOW SENSITIVITIES TO FOLLOW  04-Jan-14 17:44    Micro Text Report BLOOD CULTURE   COMMENT  NO GROWTH IN 8-12 HOURS   ANTIBIOTIC                        Specimen Source lt hand   Culture Comment NO GROWTH IN 8-12 HOURS  Result(s) reported on 05 Jul 2012 at 08:26AM.    17:55    Micro Text Report BLOOD CULTURE   COMMENT                   NO GROWTH IN 8-12 HOURS   ANTIBIOTIC                        Specimen Source rt hand   Culture Comment NO GROWTH IN 8-12 HOURS  Result(s) reported on 05 Jul 2012 at 08:26AM.    21:36    Organism Name UNIDENTIFIED ORGANISM   Organism Quantity >100,000 CFU/ML   Micro Text Report URINE CULTURE   ORGANISM 1                >100,000 CFU/ML UNIDENTIFIED ORGANISM   COMMENT                   ID TO FOLLOW SENSITIVITIES TO FOLLOW   ANTIBIOTIC                        Specimen Source CLEAN CATCH   Organism 1 >100,000 CFU/ML UNIDENTIFIED ORGANISM   Culture Comment ID TO FOLLOW SENSITIVITIES TO FOLLOW  Result(s) reported on 05 Jul 2012 at 11:57AM.  Routine Chem:  03-Jan-14 21:36    Creatinine (comp) 1.29   Anion Gap 7  04-Jan-14 04:52    Creatinine (comp) 0.94   Anion Gap 9  05-Jan-14 04:21    Creatinine (comp) 0.98   Anion Gap 7  Routine Hem:  03-Jan-14 21:36    WBC (CBC)  15.7   Hemoglobin (CBC)  11.5   Neutrophil #  13.3  04-Jan-14 04:52    WBC (CBC)  14.3   Hemoglobin (CBC)  11.1   Neutrophil #  11.4  05-Jan-14 04:21    WBC (CBC)  11.1   Hemoglobin (CBC)  9.6   Neutrophil #  8.8     Assessment/Admission Diagnosis 69 yo diabetic male with  bacterial cystitis, balanoposthitis, and urinary retention    Plan - Foley catheter placed - 800 ml clear yellow urine drained - Keep foley catheter for 72 hours while on empiric antibiotics. Then can have a trial of void. Bladder scan for post-void residuals. - Adjust antibiotics based on organism and susceptibility of blood and urine cultures - Agree with tamsulosin - Retract foreskin and cleanse q8h. Apply topical antifungal ointment to foreskin and around foley catheter. - Will need outpatient urology follow-up.  He may require a circumcision in the future.   Electronic Signatures: Tarry KosAbern, Mouna Yager R (MD)  (Signed 05-Jan-14 13:32)  Authored: CHIEF COMPLAINT and HISTORY, PAST MEDICAL/SURGIAL HISTORY, ALLERGIES, OTHER MEDICATIONS, FAMILY AND SOCIAL HISTORY, REVIEW OF SYSTEMS, PHYSICAL EXAM, LABS, ASSESSMENT AND PLAN   Last Updated: 05-Jan-14 13:32 by Tarry KosAbern, Deasiah Hagberg R (MD)

## 2014-10-22 NOTE — Consult Note (Signed)
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   Cardiac history includes abnormal stress test concerning for lateral wall ischemia in 2008, DES-80% mid LCx on follow-up cath (interventionalist- Dr. Kirke Corin); 2D echo 11/2012: EF 45-50%, borderline RV enlargement.  He does not follow-up w/ a cardiologist.  He was admitted twice last year w/ UTIs. Complicated by urinary retention and hydronephrosis in 07/2012. EGD 07/2013- Barrett's esophagus, nl stomach and duodenum. Colonoscopy- terminated due significant stool, poor preparation.   The patient's history is very vague. Unclear if he has dementia or an acute encephalopathy. He was admitted for a 2 day history of worsening dyspnea. He does have baseline dyspnea from COPD. He notes increased LE edema. He denies chest pain, palpitations, syncope, PND or orthopnea. He also says he has been having "the shakes" durnig this time. He does not know how he came to the hospital. He lives in a trailer by himself, still drives. EMS was called at some point. EKG tracing in the field revealed a-fib w/ RVR 120-140 bpm.   Present Illness In the ED, EKG confirmed atrial fibrillation w/ RVR, LAD, no ST/T changes. Initial TnI WNL. BNP elevated at 3807. CBC- no leukocytosis, PLT 88K. U/a negative nitrites, leukocyte esterase, 14 RBC/hpf. Noncontrast head CT- mildly increased nonspecific left basal ganglia hypodensity, no acute abnormality. CXR- mild interstitial edema, small bilateral pleural effusions, consistent with congestive heart failure. He was started on diltiazem gtt, eventually transitioned to PO Cardizem, metoprolol added. Converted to NSR at 330 this AM.  On Lasix 40mg  BID. No I/Os recorded.  TSH returned elevated at 4.75. He has ruled out w/ 3 normal troponins. Echo performed, interp pending.  PAST MEDICAL HISTORY: 1.  CAD with stent placement. The patient has a history of coronary disease with previous stent placement in the past. He had a cardiac catheterization in 2009 which showed patent stents with an EF of 60%. 2.  Hyperlipidemia.  3.  Hypertension.  4.  Type 2 diabetes.  5.  Urinary retention with bilateral hydronephrosis.  6.  COPD. 7.  Ongoing tobacco use.  8.  Sleep apnea, uses CPAP.  9.  Insomnia.  10.  Congestive heart failure, likely diastolic.  11.  Neuropathy with diabetes.  12.  Peripheral angiopathy disease.  13.  Thrombocytopenia.  14.  Vitamin B deficiency.  ALLERGIES: CODEINE.   SOCIAL HISTORY: The patient says he lives alone in a trailer. He continues to smoke about 1/3 pack of cigarettes a day. No IV drug use or alcohol use.   PAST SURGICAL HISTORY: Cholecystectomy.   FAMILY HISTORY: Unknown.   Physical Exam:  GEN no acute distress, disheveled   HEENT pink conjunctivae, PERRL, hearing intact to voice   NECK supple  No masses  trachea midline  JVP 7 cm, no bruits   RESP normal resp effort  clear BS  no use of accessory muscles  fine bibasilar rales   CARD Regular rate and rhythm  Normal, S1, S2  No murmur   ABD denies tenderness  soft  normal BS   EXTR negative cyanosis/clubbing, 1+ bilateral pretibial pitting LE edema   SKIN normal to palpation   NEURO positive tremor, follows commands,  motor/sensory function intact   PSYCH alert, A&O x 2 to person, place. Unable to recount year, month or situation.   Review of Systems:  Subjective/Chief Complaint shortness of breath   Respiratory: Short of breath   Cardiovascular: Dyspnea   Review of Systems: All other systems were reviewed and found to be negative   Home Medications: Medication Instructions Status  Vitamin D2 50,000 intl units oral  capsule 1 cap(s) orally once a week for 8 weeks, then 1 capsule once a month Active  glimepiride 4 mg oral tablet 1 tab(s) orally 2 times a day Active  metoprolol tartrate 25 mg oral tablet 1 tab(s) orally 2 times a day Active  magnesium oxide 400 mg (241.3 mg elemental magnesium) oral tablet 1 tab(s) orally 2 times a day Active  Lyrica 100 mg oral capsule 1 cap(s) orally 2 times a day for neuropathy Active  omeprazole 40 mg oral delayed release capsule 1 cap(s) orally once a day for GERD Active  Lipitor 40 mg oral tablet 1 tab(s) orally once a day (at bedtime) for cholesterol Active  metFORMIN 500 mg oral tablet 1 tab(s) orally 2 times a day Active  Advair Diskus 500 mcg-50 mcg inhalation powder 1 puff(s) inhaled 2 times a day Active  Spiriva 18 mcg inhalation capsule 1 cap(s) inhaled once a day Active  Aspirin Enteric Coated 81 mg oral delayed release tablet 1 tab(s) orally once a day for heart protection Active  Ventolin HFA CFC free 90 mcg/inh inhalation aerosol 2 puff(s) inhaled every 6 hours, As Needed - for Shortness of Breath Active  Flomax 0.4 mg oral capsule 1 cap(s) orally once a day Active  albuterol 2.5 mg/3 mL (0.083%) inhalation solution 3 milliliter(s) inhaled 4 times a day, As Needed - for Shortness of Breath Active  Singulair 10 mg oral tablet 1 tab(s) orally once a day (at bedtime) for allergies/COPD Active  MiraLax - oral powder for reconstitution 17 gram(s) dissolved in 4-8 ounces of water or juice orally once a day Active  Tylenol 650 mg oral tablet, extended release 1 tab(s) orally 2 times a day for arthritis Active  Wellbutrin XL 150 mg/24 hours oral tablet, extended release 1 tab(s) orally once a day for smoking cessation Active  Nasonex 50 mcg/inh nasal spray 2 spray(s) nasal once a day for allergies Active  Bactroban 2% topical ointment Apply topically to affected area on right leg 2 times a day Active  Eucerin - topical cream Apply topically to affected area 1 to 2  times a day for dry skin Active  furosemide 20 mg oral tablet 1 tab(s) orally once a day, As Needed for edema Active  Imodium A-D 2 mg oral tablet 2 tablets at first sign of diarrhea, then 1 tab(s) orally every 6 hours, As Needed - for Diarrhea Active  ondansetron 4 mg oral tablet 1 tab(s) orally every 8 hours, As Needed for nausea Active  Tylenol 325 mg oral tablet 2 tab(s) orally every 6 hours, As Needed - for Pain Active  Robitussin DM Sugar Free 10 mg-100 mg/5 mL oral liquid 10 milliliter(s) orally every 6 hours, As Needed for cough Active  Lantus Solostar Pen 100 units/mL subcutaneous solution 60 unit(s) subcutaneous 2 times a day Active  NovoLOG FlexPen 100 units/mL subcutaneous solution 10 unit(s) subcutaneous 3 times a day (with meals) Active  Plavix 75 mg oral tablet 1 tab(s) orally once a day Active  traZODone 50 mg oral tablet 1 tab(s) orally once a day (at bedtime)  for sleep Active  amLODIPine 2.5 mg oral tablet 1 tab(s) orally once a day Active   Lab Results:  Thyroid:  20-Feb-15 01:53   Thyroid Stimulating Hormone  4.75 (0.45-4.50 (International Unit)  ----------------------- Pregnant patients have  different reference  ranges for TSH:  - - - - - - - - - -  Pregnant, first trimetser:  0.36 - 2.50 uIU/mL)  Routine Chem:  19-Feb-15 15:14   B-Type Natriuretic Peptide [facility])  3807 (Result(s) reported on 19 Aug 2013 at 03:48PM.)  Cardiac:  19-Feb-15 15:14   CPK-MB, Serum  3.9 (Result(s) reported on 19 Aug 2013 at 09:35PM.)  Troponin I < 0.02 (0.00-0.05 0.05 ng/mL or less: NEGATIVE  Repeat testing in 3-6 hrs  if clinically indicated. >0.05 ng/mL: POTENTIAL  MYOCARDIAL INJURY. Repeat  testing in 3-6 hrs if  clinically indicated. NOTE: An increase or decrease  of 30% or more on serial  testing suggests a  clinically important change)    22:34   CPK-MB, Serum  4.3 (Result(s) reported on 19 Aug 2013 at 11:11PM.)  Troponin I 0.02 (0.00-0.05 0.05 ng/mL or less:  NEGATIVE  Repeat testing in 3-6 hrs  if clinically indicated. >0.05 ng/mL: POTENTIAL  MYOCARDIAL INJURY. Repeat  testing in 3-6 hrs if  clinically indicated. NOTE: An increase or decrease  of 30% or more on serial  testing suggests a  clinically important change)  20-Feb-15 01:53   CPK-MB, Serum  4.1 (Result(s) reported on 20 Aug 2013 at 02:25AM.)  Troponin I < 0.02 (0.00-0.05 0.05 ng/mL or less: NEGATIVE  Repeat testing in 3-6 hrs  if clinically indicated. >0.05 ng/mL: POTENTIAL  MYOCARDIAL INJURY. Repeat  testing in 3-6 hrs if  clinically indicated. NOTE: An increase or decrease  of 30% or more on serial  testing suggests a  clinically important change)  Routine UA:  19-Feb-15 18:48   Blood (UA) 2+  RBC (UA) 14 /HPF   EKG:  Interpretation atrial fibrillation, LAD, no ST/T changes   Rate 113   EKG Comparision Changed from  11/2012 tracing (SR at that time)   Radiology Results: XRay:    19-Feb-15 15:53, Chest 1 View AP or PA  Chest 1 View AP or PA   REASON FOR EXAM:    afib  COMMENTS:       PROCEDURE: DXR - DXR CHEST 1 VIEWAP OR PA  - Aug 19 2013  3:53PM     CLINICAL DATA:  History of atrial fibrillation.  And malaise    EXAM:  CHEST - 1 VIEW    COMPARISON:  Portable chest x-ray of December 17, 2012    FINDINGS:  The lungs are well-expanded. The left hemidiaphragm is obscured and  there is new blunting of the right lateral costophrenic angle. No  alveolar infiltrate is demonstrated. The cardiac silhouette is  mildly enlarged. The pulmonary vascularity is mildly engorged. A  previously demonstrated dense infiltrate in the left mid lung has  cleared. The observed portions of the bony thorax appear normal.     IMPRESSION:  The findings are consistent with congestive heart failure with mild  interstitial edema and small bilateral pleural effusions.      Electronically Signed    By: David  [place]    On: 08/19/2013 15:54       Verified By: DAVID A. [place],  M.D., MD  CT:    19-Feb-15 16:19, CT Head Without Contrast  CT Head Without Contrast   REASON FOR EXAM:    altered  mental status  COMMENTS:       PROCEDURE: CT  - CT HEAD WITHOUT CONTRAST  - Aug 19 2013  4:19PM     CLINICAL DATA:  69 year old male with altered mental status, not  feeling well. Initial encounter.    EXAM:  CT HEAD WITHOUT CONTRAST    TECHNIQUE:  Contiguous axial images were obtained from the base of the skull  through the vertex without intravenous contrast.  COMPARISON:  12/17/2012.    FINDINGS:  Visualized paranasal sinuses and mastoids are clear. Calcified  atherosclerosis at the skull base. Stable, negative orbit and scalp  soft tissues. No acute osseous abnormality identified.    Cerebral volume is within normal limits for age. Mildly increased  nonspecific hypodensity involving the left lentiform nuclei.  Elsewhere gray-white matter differentiation is within normal limits  throughout the brain. No midline shift, ventriculomegaly, mass  effect, evidence of mass lesion, intracranial hemorrhage or evidence  of cortically based acute infarction. Gray-whitematter  differentiation is within normal limits throughout the brain. No  suspicious intracranial vascular hyperdensity. Perhaps mild  intracranial artery dolichoectasia.     IMPRESSION:  No acute intracranial abnormality. Mildly increased nonspecific left  basal ganglia hypodensity.      Electronically Signed    By: Augusto Gamble M.D.    On: 08/19/2013 16:20         Verified By: Kevan Ny. 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   1. New onset atrial fibrillation w/ RVR Noted on arrival. Converted to SR w/ cardizem and metopolol PO. Underlying COPD predisposes to atrial arrhythmias. CHA2DS2VASc = 5 (CHF, HTN, DM2, CAD, age > 21). Evidence of hematuria on u/a. Thrombocytopenic as well. Question of compliance w/ underlying dementia vs encephalopathy. The patient is at high risk for cardioembolism, however these factors make him a less than optimal candidate for anticoagulation.  -- Would hold on anticoagulation until dementa/encephalopathy and hematuria further evaluated -- Discuss ASA, Plavix w/ MD -- Continue PO Cardizem, Lopressor. He is maintaining SR. -- Option may be to maintain SR w/ an AAD; however, this is not a reliable method to protect against TE from a-fib  2. Acute on chronic combined CHF Cardiomyopathy, ischemic +/- hypertensive. EF 45-50%, diastolic dysfunction on 07/2012 echo. BNP ~4K on admission. Exacerbated by atrial fibrillation w/ RVR. PEx reveals bilateral JVD, LE edema, fine bibasilar rales. H/o CAD s/p PCI, at high very risk for progression of CAD given significant RFs.  -- Continue Lasix  IV BID + KCl 20 mEq BID, monitor renal function, electrolytes w/ this -- Start ACEi/ARB, consider switching BBs to Toprol-XL or bisoprolol -- Review 2D echo- if EF reduced or WMAs, will need ischemic eval -- TED hose,  daily weights, strict I/Os, Na/fluid restriction   Plan 3. CAD Denies chest pain. Admitted w/ decompensated CHF from a-fib w/ RVR. H/o CAD in the past, s/p DES-LCx in 2008. Multiple cardiac RFs in DM2, HTN, HLD, ongoing tobacco use. Has ruled out.   -- Continue ASA, Plavix, BB, statin -- Add ACEi/ARB -- Consider ischemic eval based on echo findings  4. Dementia vs encephalopathy A&O x 2. Unclear baseline mental status. Delayed response to questions, inattentive.  -- Consider neurology consult, ? vascular dementia. Management per primary team.   5. COPD/ongoing tobacco abuse Smoking cessation stressed.  -- Management per primary team  6. Hypertension Remains elevated this AM.  -- Continue Cardizem, Toprol-XL -- Add ACEi/ARB w/ DM2, CAD, reduced EF  7. Hyperlipidemia -- Check lipid panel -- Continue statin  8. DM2 Check Hgb A1C.  -- SSI per primary team  9. Tremor -- Consider neurology consult. Management per primary team.  10. Thrombocytopenia 80K, hematuria.  -- Avoid heparin -- Discuss ASA, Plavix w/ MD  11. Hematuria Noted on u/a. -- Urology consult   Electronic Signatures for Addendum Section:  Lorine Bears (MD) (Signed Addendum 20-Feb-15 17:59)  The patient was seen and examined. Agree with the above. He presented with worsening dyspnea and was found to be in heart failure and A-fib with RVR which was new. He convered to NSR with Diltiazem. He is feeling better. He reports recent falls and unsteady gate.  Still with few crackles by exam. Echo showed normal EF.  Recommend: Continue Metoprolol, switch Diltiazem to long acting before discharge. Anticoagulation for A-fib is indicated. However, he is having unsteady gate and frequent falls of unclear etiolgy. Also he reports hematuria and does have thrombocytopenia. I don't recommend anticoagualtion now. Continue Aspirin and Plavix (he has been on this since his PCI in 2008).  Switch Lasix in AM to 40 mg once daily.   Electronic Signatures: Gery Pray (PA-C)  (Signed 20-Feb-15 10:58)  Authored: General Aspect/Present Illness, History and Physical Exam, Review of System, Home Medications, Labs, EKG , Radiology, Allergies, Vital Signs/Nurse's Notes,  Impression/Plan Lorine Bears (MD)  (Signed 20-Feb-15 17:59)  Co-Signer: General Aspect/Present Illness, History and Physical Exam, Review of System, Home Medications, Labs, EKG , Radiology, Allergies, Vital Signs/Nurse's Notes, Impression/Plan   Last Updated: 20-Feb-15 17:59 by Lorine Bears (MD)

## 2014-10-22 NOTE — H&P (Signed)
PATIENT NAME:  Bruce Parsons, [name] MR#:  [id number] DATE OF BIRTH:  [birth year]  DATE OF ADMISSION:  09/10/2013  ADDENDUM  The patient has cut down dramatically on his tobacco abuse. The patient was counseled regarding his tobacco abuse for 4 minutes. He is actually taking Wellbutrin to help him quit smoking totally, and I commended him on that and discussed with him to continue so he will completely quit.   ____________________________ Corliss Coggeshall P. Juliene PinaMody, MD spm:sb D: 09/10/2013 15:44:02 ET T: 09/10/2013 16:18:44 ET JOB#: [id number]  cc: Camren Henthorn P. Juliene PinaMody, MD, <Dictator> Janyth ContesSITAL P Gianpaolo Mindel MD ELECTRONICALLY SIGNED 09/10/2013 16:37

## 2014-10-22 NOTE — Discharge Summary (Signed)
PATIENT NAME:  Bruce Parsons, Bruce Parsons MR#:  [id number] DATE OF BIRTH:  1946-05-06  DATE OF ADMISSION:  09/27/2013 DATE OF DISCHARGE:  10/02/2013   ADMISSION DIAGNOSIS: Acute on chronic diastolic heart failure.   DISCHARGE DIAGNOSES:  1. Acute on chronic diastolic heart failure.  2. Hypoglycemia with a history of diabetes.  3. Pneumonia ruled out.  4. Atrial fibrillation.  5. Confusion, improved.  6. History of coronary artery disease. 7. Diabetes.  8. Hypertension.  9. Chronic obstructive pulmonary disease.  10. History of benign prostatic hypertrophy.   CONSULTATIONS: None.   PERTINENT LABORATORIES:  Sodium 138, potassium 4.2, chloride 99, bicarbonate 36, BUN 17, creatinine 1.36, glucose is 129.  Blood cultures negative to date.   HISTORY OF PRESENT ILLNESS: This is a 69 year old male who was admitted for acute on chronic diastolic heart failure. For further details, please refer to the H and P.   HOSPITAL COURSE: 1. Acute on chronic diastolic heart failure. The patient was placed on IV Lasix. He has improved dramatically. He will be discharged with p.o. Lasix and will need weekly BMP followup. His echo from previous hospitalization showed an EF of 55%. The patient will continue with outpatient medications. He has been in the hospital multiple times. There was a question whether or not the patient will need LTAC. The patient did not want LTAC. He will be discharged to [facility].  2. Hypoglycemia. The patient's blood sugars were low. We stopped metformin and glipizide. We have changed his insulin to Levemir b.i.d. He will need close followup by his PCP at [facility].  3. Pneumonia has been ruled out. His antibiotics initially started; these have been discontinued. 4. History of atrial fibrillation. His rate is controlled. His CHADS score is greater than 2. He is on Eliquis. He is also on Plavix. His aspirin has been stopped due to high risk of GI bleed on all these medications. Side  effects, alternatives, benefits and risks were discussed with the patient about all of these anticoagulants, and the patient understands these risks and is willing to take the risk of aspirin and Plavix. He is also on a PPI.  5. Confusion/altered mental status, prior history of drug abuse. The patient is back to his normal baseline. He had MDMA on UDS, but the patient was in a facility, so unclear exactly what the etiology is.  6. History of coronary artery disease. The patient will continue on his outpatient medications.  7. Diabetes. The patient is on Levemir. He will need close followup for this.  8. Hypertension. Once again, this will need some close outpatient monitoring as well, but his blood pressure was tolerable while in the hospital.  9. History of chronic obstructive pulmonary disease. The patient's chronic obstructive pulmonary disease was stable, and there was no acute exacerbation.   DISCHARGE MEDICATIONS:  1. Magnesium oxide 400 mg b.i.d.  2. Plavix 75 mg daily. 3. Trazodone 50 mg at bedtime.  4. Advair Diskus 500/50 b.i.d.  5. Spiriva 18 mcg daily. 6. Ventolin HFA 90 mcg 2 puffs q.6 hours p.r.n.  7. Flomax 0.4 mg daily.  8. Albuterol 3 mL 4 times a day p.r.n.  9. Singulair 10 mg daily.  10. Tylenol 325 two tablets p.r.n. pain.  11. Lasix 20 mg b.i.d. p.r.n. edema.  12. Zofran 4 mg q.8 hours p.r.n.  13. Cardizem 120 mg daily.  14. Metoprolol 50 mg q.12 hours.  15. Lasix 40 mg b.i.d. for CHF.  16. Eliquis 5 mg  b.i.d.  17. Insulin detemir 25 units b.i.d.  18. Insulin sliding scale.  19. Pantoprazole 40 mg daily.  20. K-Chlor 10 mEq daily.  The patient will stop glimepiride and metformin as well as aspirin.   DISCHARGE DIET: Low sodium, ADA diet.   DISCHARGE ACTIVITY: As tolerated.   DISCHARGE FOLLOWUP: The patient will follow up with the MD at the facility in 1 week. The patient will need a BMP weekly, next one is on 10/04/2013.   CONDITION: The patient is medically  stable for discharge.   CODE STATUS: The patient is a DNR status.  TIME SPENT: Approximately 35 minutes.   ____________________________ Janyth ContesSital P. Juliene PinaMody, MD spm:lb D: 10/02/2013 11:31:24 ET T: 10/02/2013 12:11:54 ET JOB#: [id number]  cc: Gordy Goar P. Juliene PinaMody, MD, <Dictator> Durel SaltsKeatah Brooks, FNP Janyth ContesSITAL P Paeton Studer MD ELECTRONICALLY SIGNED 10/02/2013 21:34

## 2014-10-22 NOTE — Discharge Summary (Signed)
PATIENT NAME:  Bruce Parsons, Bruce Parsons MR#:  [id number] DATE OF BIRTH:  April 21, 1946  DATE OF ADMISSION:  08/30/2013 DATE OF DISCHARGE:  08/30/2013  ADDENDUM  BRIEF SUMMARY OF HOSPITAL COURSE: [name] is a 69 year old gentleman with history of CAD with congestive heart failure, diastolic hypertension, diabetes, COPD, tobacco dependence, comes in with new-onset atrial fibrillation with RVR and congestive heart failure. He was admitted with:  1.  New-onset AFib with RVR. The patient received IV diltiazem then placed on p.o. diltiazem 30 mg q.6 hourly, changed to Cardizem CD. His metoprolol was continued 25 b.i.d. Cardiology consultation with Dr. Mariah MillingGollan was placed. The patient is in sinus rhythm, not a good candidate for long-term anticoagulation given repeated history of UTI and hematuria. His echo showed an EF of 55%. TSH was within normal limits. The patient will follow up with Dr. Mariah MillingGollan as outpatient.  2.  Acute congestive heart failure, likely diastolic, related to his tachycardia from AFib. Lasix IV 40 mg b.i.d. was given. The patient diuresed well. Sats were stable. He was changed to p.o. home dose of 20 mg Lasix.  3.  Type 2 diabetes, p.o. meds with Lantus as a sliding-scale insulin was used.  4.  Tobacco dependence. The patient was encouraged to stop smoking. He says he is trying to stop smoking. He is on Wellbutrin.  5.  History of CAD with stents in the past. Continue Plavix and metoprolol including statins.  6.  COPD. Outpatient inhalers were continued.  7.  Hyperlipidemia. Continue Lipitor.  8.  Confusion, unable to focus well. Having conversation, much better today. MRI was negative for CVA. Discussed with family that it could be signs of early dementia as well.   The [facility] by [facility]. Hospital stay otherwise remained stable.   CODE STATUS: THE PATIENT REMAINED A NO CODE, DNR.   The patient will follow up with the [facility].   TIME SPENT: 40 minutes.    ____________________________ Wylie HailSona A. Allena KatzPatel, MD sap:np D: 09/07/2013 15:22:19 ET T: 09/07/2013 16:10:35 ET JOB#: [id number]  cc: Yashar Inclan A. Allena KatzPatel, MD, <Dictator> Willow OraSONA A Catheryne Deford MD ELECTRONICALLY SIGNED 09/14/2013 15:07

## 2014-10-22 NOTE — H&P (Signed)
PATIENT NAME:  Bruce Parsons, Fausto R MR#:  [id number] DATE OF BIRTH:  10-11-1945  DATE OF ADMISSION:  09/10/2013  PRIMARY CARE PHYSICIAN: Durel SaltsKeatah Brooks [facility])   CHIEF COMPLAINT: Pulmonary edema.   HISTORY OF PRESENT ILLNESS: This is a 69 year old male with diastolic heart failure with ejection fraction of 55% by echocardiogram January 2015, ongoing tobacco use, hypertension, hyperlipidemia, diabetes with diabetic neuropathy, PAD, and OSA on CPAP machine who is directly admitted for increasing lower extremity edema and anasarca. The patient was recently hospitalized for new-onset atrial fibrillation, converted to normal sinus rhythm, as well as acute on chronic CHF exacerbation. Since his discharge he has had increasing weight gain. He says he has gained about 20 pounds. His baseline weight is approximately 220 and his weight here is documented 238.Marland Kitchen. His outpatient PA was concerned about the weight gain and increasing lower extremity edema and shortness of breath so the patient was directly admitted for acute CHF exacerbation. The patient is complaining of shortness of breath, wheezing, PND, increasing lower extremity edema, and weight gain. He continues to smoke about 4 cigarettes every 2 days.   REVIEW OF SYSTEMS:  CONSTITUTIONAL: No fever. Positive fatigue, weakness. Positive weight gain. EYES: No blurry or double vision or glaucoma.  ENT: No ear pain, hearing loss. Positive snoring.  RESPIRATORY: Positive cough. Positive wheezing. Positive COPD, on oxygen at home. Positive dyspnea. CARDIOVASCULAR: No chest pain. Positive orthopnea. Positive edema. Positive dyspnea on exertion. No palpitations, syncope. GASTROINTESTINAL: No nausea, vomiting, diarrhea, abdominal pain, melena, or ulcers. GENITOURINARY: No dysuria. ENDOCRINE: No polyuria or polydipsia.  HEME AND LYMPH: No bleeding, swollen glands. SKIN: No rash or lesions. MUSCULOSKELETAL: Positive limited activity due to edema.   NEUROLOGIC: No history of CVA, TIA. PSYCHIATRIC: No history of anxiety or depression.   PAST MEDICAL HISTORY: 1.  The patient was hospitalized in February 2015 with new-onset atrial fibrillation, converted to normal sinus rhythm, and was seen by [facility].  2.  CAD with stent placement. He had a cardiac catheterization in 2009 which showed patent stents. He had an echocardiogram in 2015 which showed normal ejection fraction, positive diastolic dysfunction.  3.  Hyperlipidemia.  4.  Hypertension. 5.  Type 2 diabetes. 6.  Urinary retention with bilateral hydronephrosis in the past.  7.  COPD with chronic respiratory failure, on 2 liters of oxygen. 8.  Ongoing tobacco use.  9.  Sleep apnea, uses CPAP machine.  10.  Diastolic heart failure.  11.  Diabetic neuropathy.  12.  PAD. 13.  Chronic thrombocytopenia.  14.  Vitamin D deficiency.   ALLERGIES: CODEINE.  SOCIAL HISTORY: The patient lives alone. He smokes about 4 cigarettes every few days. He used to smoke about a third to 1 pack a day.  PAST SURGICAL HISTORY: Cholecystectomy.   FAMILY HISTORY: The patient does not know his family history.  MEDICATIONS: 1.  Glimepiride 4 mg b.i.d.  2.  Metoprolol 25 mg b.i.d.  3.  Magnesium oxide 400 mg b.i.d.  4.  Plavix 75 mg daily. 5.  Trazodone 50 mg at bedtime. 6.  Lyrica 100 mg b.i.d.  7.  Omeprazole 40 mg daily. 8.  Lipitor 40 mg daily.  9.  Metformin 500 b.i.d.  10.  Advair Diskus 500/50 b.i.d.  11.  Spiriva 18 mcg daily.  12.  Aspirin 81 mg daily.  13.  Ventolin HFA 2 puffs q. 6 hours p.r.n.  14.  Flomax 0.4 mg daily.  15.  Albuterol 3 mL 4 times a day  as needed.  16.  Singulair 10 mg daily.  17.  MiraLax 17 grams daily.  18.  Wellbutrin 150 mg for smoking cessation.  19.  Nasonex 50 mcg inhalation 2 sprays daily.  20.  Lasix 20 mg daily.  21.  Tylenol 325 two tablets q. 6 hours p.r.n. pain.  22.  Lantus 60 units b.i.d.   23.  NovoLog sliding scale 10 units t.i.d.   24.  Vitamin D2 50,000 international units monthly.  25.  Diltiazem 120 mg daily.   PHYSICAL EXAMINATION:  VITAL SIGNS: Temperature 98.2, pulse 62, respirations 24, blood pressure 141/82, 93% on 2 liters of oxygen.  GENERAL: The patient is alert and oriented, not in acute distress but he has increasing work of breathing.  HEENT: Head is atraumatic. Pupils are round and reactive. Sclerae anicteric. Mucous membranes are moist. Oropharynx is clear.  NECK: Supple. There is positive JVD up to the level of the neck. No enlarged thyroid. No carotid bruit.  HEART: Regular rate and rhythm. No murmurs, gallops, or rubs. PMI is hard to palpate due to body habitus.  LUNGS: He has fair air movement with crackles at the bases and bilateral wheezing. No rhonchi or rales are heard.  ABDOMEN: Bowel sounds are positive. Nontender. No rebound or guarding. Hard to appreciate organomegaly due to body habitus. EXTREMITIES: 2+ edema all the way up his scrotum.  NEUROLOGIC: Cranial nerves II through XII are grossly intact. There is no focal deficit.  SKIN: He has on his right shin a little weeping area.   DIAGNOSTIC DATA: Laboratory: There no laboratories at this time.   The patient's chest x-ray is pending.   A 2-D echocardiogram from February 2015 showed an EF of 55% to 60% with mildly dilated left atrium and right atrium, mild to moderate MVR, mild aortic valve sclerosis without stenosis.   ASSESSMENT AND PLAN: A 68 year old male with chronic diastolic heart failure, obstructive sleep apnea and on CPAP machine, and diabetes with diabetic neuropathy who presents with acute congestive heart failure exacerbation, diastolic in nature.  1.  Acute on chronic diastolic heart failure. The patient has lower extremity edema and jugular venous distention. He also has some wheezing and crackles on exam. This is likely congestive heart failure exacerbation, diastolic in nature. The patient will be placed on Lasix 40 IV q. 8  hours with I's and O's and daily weights. Continuing his outpatient medications. I have not placed a consultation with cardiology, but if needed we may consult them, and I would follow the creatinine closely.  2.  Chronic obstructive pulmonary disease with acute exacerbation. The patient has some wheezing on examination. I am going to check a chest x-ray to make sure the patient does not have a pneumonia, but he definitely has some wheezing which could be cardiac in nature, but he is also having some shortness of breath and a cough, so I have started him on IV steroids and Levaquin for his chronic obstructive pulmonary disease exacerbation. Continue his outpatient medications as well as continuing with DuoNebs q. 4 hours while awake.  3.  Diabetes. We will continue his outpatient medications, with the exception of metformin. The patient is on ADA diet and sliding scale insulin.  4.  Hypertension. Continue his outpatient medications.  5.  History of coronary artery disease. We will continue his outpatient medications including aspirin and Plavix, in addition to metoprolol.   The patient has a DNR status.  TIME SPENT: Approximately 50 minutes. ____________________________ Janyth Contes. Audreyanna Butkiewicz,  MD spm:sb D: 09/10/2013 15:41:03 ET T: 09/10/2013 15:58:27 ET JOB#: [id number]  cc: Corine Solorio P. Juliene Pina, MD, <Dictator> [facility] [place] P Reace Breshears MD ELECTRONICALLY SIGNED 09/10/2013 16:37

## 2014-10-22 NOTE — H&P (Signed)
PATIENT NAME:  Bruce Parsons, Bruce Parsons MR#:  [id number] DATE OF BIRTH:  1945/12/27  DATE OF ADMISSION:  08/19/2013  PRIMARY CARE PHYSICIAN: Durel SaltsKeatah Brooks, [facility].  CHIEF COMPLAINT: Shortness of breath.   HISTORY OF PRESENT ILLNESS: This is a 69 year old male with a history of CAD with stent placement, hypertension, diabetes, COPD, chronic smoking, who presents with the above complaint. Over the past 2 days, the patient has had increasing shortness of breath. He was noted to have atrial fibrillation upon arrival in the ER where he received diltiazem. He also is complaining of some chest pain associated with the palpitations. The patient is not a very good historian.   REVIEW OF SYSTEMS:    CONSTITUTIONAL: No fever. Positive fatigue, weakness.  EYES: No blurred or double vision or glaucoma. EARS, NOSE, THROAT: No ear pain, hearing loss, seasonal allergies, snoring. RESPIRATORY: No cough, wheezing, hemoptysis, COPD. Positive shortness of breath. CARDIOVASCULAR: Positive chest pain, seems to be associated with palpitations. No orthopnea, edema, arrhythmia, dyspnea on exertion, syncope.  GASTROINTESTINAL: No nausea, vomiting, diarrhea, abdominal pain, melena or ulcers. GENITOURINARY: No dysuria or hematuria. ENDOCRINE: No polyuria or polydipsia. HEMATOLOGIC AND LYMPHATIC: No anemia or easy bruising. SKIN: No rashes or lesions. MUSCULOSKELETAL: He has some arthritis. He has some limited activity. NEUROLOGIC: No history of CVA or TIA. PSYCHIATRIC: No history of anxiety or depression.  PAST MEDICAL HISTORY: 1.  CAD with stent placement. The patient has a history of coronary disease with previous stent placement in the past. He had a cardiac catheterization in 2009 which showed patent stents with an EF of 60%. 2.  Hyperlipidemia.  3.  Hypertension.  4.  Type 2 diabetes.  5.  Urinary retention with bilateral hydronephrosis.  6.  COPD. 7.  Ongoing tobacco use.  8.  Sleep apnea, uses  CPAP.  9.  Insomnia.  10.  Congestive heart failure, likely diastolic.  11.  Neuropathy with diabetes.  12.  Peripheral angiopathy disease.  13.  Thrombocytopenia.  14.  Vitamin B deficiency.   MEDICATIONS: 1.  Advair Diskus 500/50. 2.  Albuterol 3 mL 4 times a day as needed.  3.  Norvasc 2.5 mg daily.  4.  Aspirin 81 mg daily. 5.  Bactroban to affected area.  6.  Flomax 0.4 mg daily.  7.  Glimepiride 4 mg b.i.d.  8.  Imodium p.Parsons.n.  9.  Lantus 60 units b.i.d.  10.  Lipitor 40 mg daily. 11.  Lyrica 100 mg b.i.d.  12.  Magnesium oxide 400 mg b.i.d.  13.  Metformin 500 mg b.i.d.  14.  Metoprolol 25 mg b.i.d.  15.  MiraLax 17 grams daily.  16.  Nasonex 2 sprays daily.  17.  NovoLog sliding scale 10 units t.i.d. with meals.  18.  Omeprazole 40 mg daily.  19.  Zofran 4 mg q.8 hours p.Parsons.n.  20.  Plavix 75 mg daily.  21.  Singulair 10 mg daily. 22.  Spiriva 18 mcg daily. 23.  Trazodone 50 mg at bedtime p.Parsons.n.  24.  Tylenol 2 tablets q.6 hours p.Parsons.n.  25.  Vitamin D 50,000 international units once a month.  26.  Wellbutrin 150 mg for smoking cessation.   ALLERGIES: CODEINE.   SOCIAL HISTORY: The patient says he lives alone in a trailer. He continues to smoke about 1/3 pack of cigarettes a day. No IV drug use or alcohol use.   PAST SURGICAL HISTORY: Cholecystectomy.   FAMILY HISTORY: Unknown.   PHYSICAL EXAMINATION:    VITAL SIGNS:  Temperature 98.5, pulse 116, respirations 24, blood pressure 145/96, 96% on room air.  GENERAL: The patient is alert and oriented, has a flat affect.  HEENT: Head is atraumatic. Pupils are round and reactive. Sclerae are anicteric. Mucous membranes are dry. Oropharynx is clear.  NECK: Supple without JVD, carotid bruit, or enlarged thyroid.  CARDIOVASCULAR: Irregularly irregular tachycardia with II/VI diastolic murmur heard best at the right sternal border. PMI is not displaced. LUNGS: Clear to auscultation, except for the bases which have crackles  and mild rales at the bases. No rhonchi or wheezing heard. Normal chest expansion.  BACK: No CVA or vertebral tenderness.  ABDOMEN: Bowel sounds are positive. Nontender, nondistended. No hepatosplenomegaly.  EXTREMITIES: No clubbing, cyanosis, or edema.  NEUROLOGIC: Cranial nerves II through XII are grossly There are no focal deficits.  SKIN: Without any rashes or lesions.   LABORATORY, DIAGNOSTIC AND RADIOLOGICAL DATA: Sodium 140, potassium 4.6, chloride 103, bicarbonate 36, BUN 15, creatinine 0.98, glucose 88, calcium 9.4. BNP 307. White blood cells 6.6, hemoglobin 11, hematocrit 34, platelets 88. Troponin less than 0.02. INR is 1.0. Chest x-ray shows pulmonary edema. Urinalysis shows negative leukocyte esterase or nitrites. EKG reads atrial fibrillation with a heart rate of 118.   ASSESSMENT AND PLAN:  This is a 69 year old male with a history of coronary artery disease; congestive heart failure, likely diastolic in nature; hypertension; diabetes; chronic obstructive pulmonary disease; smoking dependence, who presents with new-onset atrial fibrillation and rapid ventricular response as well as congestive heart failure.  1.  New-onset atrial fibrillation and rapid ventricular response: Will add diltiazem 30 mg p.o. q.6 hours, continue metoprolol 25 b.i.d. Check a TSH. Check an echocardiogram. Cardiology consult. The patient will be admitted to telemetry. Monitor closely.  2.  Congestive heart failure, likely diastolic or related to his tachycardia from his atrial fibrillation: Echocardiogram has been ordered. Cardiology consult has been ordered. We will continue Lasix 40 IV q.12 hours. Monitor ins and outs and daily weight.  3.  Diabetes: We will continue outpatient medications with the exception of metformin. Will continue sliding scale insulin and ADA diet.  4.  Tobacco dependence: The patient was encouraged to stop smoking. He says he is trying to stop smoking. He is on Wellbutrin. The patient was  counseled for 3 minutes.  5.  History of coronary artery disease with stents: We will continue Plavix and metoprolol as well as a statin medication.  6.  History of chronic obstructive pulmonary disease: We will continue his outpatient inhalers.  7.  Hyperlipidemia: Continue Lipitor.  8.  The patient is a DO NOT RESUSCITATE status.   TIME SPENT: Approximately 55 minutes.    ____________________________ Janyth Contes. Juliene Pina, MD spm:jcm D: 08/19/2013 20:56:46 ET T: 08/19/2013 21:16:50 ET JOB#: [id number]  cc: Janeli Lewison P. Juliene Pina, MD, <Dictator> Durel Salts Marieclaire [facility]hausen P Mac Dowdell MD ELECTRONICALLY SIGNED 08/20/2013 10:13

## 2014-10-22 NOTE — H&P (Signed)
PATIENT NAME:  GARNELL, PHENIX MR#:  [id number] DATE OF BIRTH:  1945/07/31  DATE OF ADMISSION:  09/27/2013  REASON FOR ADMISSION:  Shortness of breath.  PRIMARY CARE PROVIDER:  Durel Salts at [place].  REFERRING PHYSICIAN:  Dorothea Glassman, MD  CHIEF COMPLAINT:  Shortness of breath.    HISTORY OF PRESENT ILLNESS:  This is a very nice 69 year old gentleman who was recently admitted over here on 09/10/2013, discharged to skilled nursing facility on September 13, 2013.  The patient apparently has been having significant increasing shortness of breath to the point that his family went to visit him and decided to bring him here to the hospital.  The patient has history of diastolic congestive heart failure, tobacco abuse, hypertension, hyperlipidemia, diabetes with neuropathy, peripheral arterial disease, obstructive sleep apnea on CPAP.  He was diagnosed recently with respiratory failure and he has been on 2 liters of oxygen at the skilled nursing facility.  The patient is a very poor historian.  He is not really able to give me any valuable information at this moment.  His sister is here in his company but she lives in [place] and she does not know much of the information.  All the information has been recollected from ER physician, skilled nursing facility and previous H and P's.  The patient had a significant weight gain, today is 260 pounds.  Whenever he was discharged, he was 238.  The patient had significant edema of his lower extremities for what compression stockings have been offered at the nursing home.  The patient states that his shortness of breath has been going on for months but it got just worse yesterday.  Other than that, he is not able to give me any further information.  REVIEW OF SYSTEMS:   CONSTITUTIONAL:  No fever, positive shortness of breath and weakness. EYES:  No blurry vision or double vision. EARS, NOSE AND THROAT:  No difficulty swallowing.  No hearing loss.  Positive for  sleep apnea with snoring. RESPIRATORY:  Positive cough, positive wheezing, positive history of COPD, positive history of smoking.  Positive history of chronic respiratory failure, on oxygen at home.  Positive dyspnea. CARDIOVASCULAR:  No chest pain.  Positive orthopnea, no syncope.  Positive edema. GASTROINTESTINAL:  No nausea, vomiting, abdominal pain, constipation, diarrhea. GENITOURINARY:  No dysuria or hematuria. ENDOCRINOLOGY:  No polyuria or polydipsia.   HEMATOLOGY AND LYMPHATIC:  No anemia, easy bruising or swollen glands.   SKIN:  No rashes or petechiae.  No ulcers. MUSCULOSKELETAL:  The patient has chronic arthritis with joint pain on a regular basis.   NEUROLOGIC:  No history of strokes.  No history of seizures.  No headaches.   PSYCHIATRIC:  No anxiety or depression.  PAST MEDICAL HISTORY: 1.  Atrial fibrillation, new onset, that was diagnosed in February 2015. 2.  Coronary artery disease, status post stent.  Last cardiac catheterization 2009.   3.  Congestive heart failure with diastolic dysfunction.  Last echocardiogram done in February shows an ejection fraction of 55%.  4.  Hyperlipidemia. 5.  Hypertension. 6.  Type 2 diabetes. 7.  Diabetic neuropathy. 8.  Peripheral artery disease.   9.  Chronic urinary retention. 10.  COPD.  11.  Chronic respiratory failure, on 2 liters of oxygen at home. 12.  Tobacco abuse. 13.  Sleep apnea.   14.  Chronic thrombocytopenia. 15.  Vitamin D deficiency. 16.  Chronic disease anemia.    ALLERGIES:  CODEINE.  SOCIAL HISTORY:  The patient used  to live alone with his dogs in his trailer but recently was discharged to a skilled nursing facility, where he is residing now.  He smokes up to 4 cigarettes a day, has not been doing it in the past week because of his admission.  He does not drink.  He does not use IV drugs.  He is retired.    PAST SURGICAL HISTORY:  Cholecystectomy.  FAMILY HISTORY:  The patient is not able to give me any  valuable information about family history.    CURRENT MEDICATIONS:  Ventolin inhaler, Tylenol as needed for pain or fever, trazodone 50 mg every night, Spiriva 18 mcg daily, Singulair 10 mg daily, Robitussin-DM as needed for cough, Plavix 75 mg daily, Zofran as needed for nausea, metoprolol 25 mg twice daily, metformin 500 mg twice daily, magnesium oxide 400 mg twice daily, Lantus 60 units once a day in the morning, glimepiride 4 mg twice daily, furosemide 20 mg twice daily, Flomax 0.4 mg daily, Cardizem 120 mg once a day, aspirin 81 mg daily, Albuterol and Atrovent nebulizers as needed, Advair Diskus 500/50 twice daily.    PHYSICAL EXAMINATION: VITAL SIGNS:  As mentioned above, his weight at discharge on March 16th was 238, today is 260.  Blood pressure 184/88, pulse 84, respirations 22, oxygen saturation was down to 70% on room air as per ER physician, temperature 98.2.   GENERAL:  The patient is alert.  He is oriented on person mostly.  He has been confused as per family members. HEENT:  His pupils are equal and reactive.  Extraocular movements are intact.  Mucosa are moist. Anicteric sclerae.  Pink conjunctivae.  No oral lesions, no oropharyngeal exudate.   NECK:  Supple, no JVD, no thyromegaly, no adenopathy, no thyroid bruit.   CARDIOVASCULAR:  Positive S3.  No displacement of PMI.   LUNGS:  Positive crackles, diffuse, on both respiratory areas.  The patient has use of accessory muscles when he moves and when he talks.  No dullness to percussion. ABDOMEN:  Distended but not tender, no hepatosplenomegaly, no masses.  Bowel sounds are positive.   GENITAL EXAM:  Negative for external lesions.  Foley catheter in place. EXTREMITIES:  Positive edema of the lower extremities +3.  No cyanosis or clubbing.   SKIN:  No rashes, petechiae or any new lesions.   MUSCULOSKELETAL:  No joint swelling or joint effusion. NEUROLOGIC:  Cranial nerves II through XII intact.  Strength is 5 out of 5 all 4 extremities  without any significant focal findings.   PSYCHIATRIC:  The patient is pleasantly confused, not very cooperative on the history.   LYMPHATIC:  Negative for lymphadenopathy in the neck or supraclavicular areas.  LABORATORY, DIAGNOSTIC AND RADIOLOGICAL DATA:   1.  Glucose 60.  BNP is 3133.  BUN 23, creatinine 1.34, potassium 4.4, CO2 37.  Negative troponin.  White count 7.1, hemoglobin 8.8, previous hemoglobin was up to 10 at discharge last time, platelet count 97 which is chronic.   2.  Urinalysis negative for urinary tract infection although there is some red blood cells.  The patient has a Foley catheter.   3.  EKG accelerated junctional rhythm, no ST depression or elevations.    ASSESSMENT AND PLAN:   1.  Acute respiratory failure secondary to congestive heart failure exacerbation.  The patient had a saturation of 70% on room air.  He does take oxygen at 2 liters but right now to keep up with oxygen saturation above 92% he is requiring  4 liters.  The patient is treated for his congestive heart failure exacerbation. 2.  Diastolic congestive heart failure exacerbation with significant weight gain from 238 to 260, significant edema, significant shortness of breath with coarse respiratory sounds and acute respiratory failure.  The patient is going to be on beta blocker, continue aspirin, Lasix at 40 mg IV q.12 hours with potassium replacement on top of that.  Once his Lasix is changed to p.o., consider stopping the potassium replacement or decreasing the dose.  Echocardiogram, no need to be repeated as the patient was recently seen over here.  At this moment, I do not think the patient requires a cardiology consultation although he has been seen by Dr. Mariah MillingGollan so, if there is no significant improvement with diuresis, will consult Dr. Mariah MillingGollan.    AS FAR AS HIS OTHER MEDICAL PROBLEMS:   1.  Coronary artery disease.  Continue aspirin and Plavix.  Continue beta blocker.   2.  As far as his atrial  fibrillation, continue Cardizem.  Seems to be well controlled, heart rate is stable. 3.  Hypertension.  The patient has significant elevation of his blood pressure.  We are going to give him nitroglycerin paste and add on hydralazine p.r.n. for systolics above 160.  The nitroglycerin could increase his circulation on the lung vasculature, could be favorable to remove the fluid.   4.  Chronic kidney disease.  The patient has increase in creatinine, seems to be stable right now.   5.  Diabetes.  Continue insulin sliding scale.  Continue Lantus.  I am going to hold metformin for now as his kidney function could worsen with the diuresis.  Continue to monitor closely.   6.  Anemia.  The patient has chronic disease anemia although this seems to be worsening with hemoglobin of 8.8.  We are going to do guaiacs.   7.  Sleep apnea.  Continue continuous positive airway pressure use. 8.  Peripheral vascular disease.  Plavix and aspirin.   9.  Chronic thrombocytopenia seems to be overall at his stable numbers.  The patient is going to be treated with heparin for deep venous thrombosis prophylaxis.  Monitor platelets closely.  If there is any significant decrease on that or worsening of the anemia, stop heparin.  For now, will continue as he is at risk of deep venous thrombosis.   10.  Gastrointestinal prophylaxis with Protonix.    TIME SPENT:  I spent about 55 minutes with this patient.    ____________________________ Felipa Furnaceoberto Sanchez Gutierrez, MD rsg:cs D: 09/27/2013 18:29:00 ET T: 09/27/2013 18:51:34 ET JOB#: [id number]  cc: Felipa Furnaceoberto Sanchez Gutierrez, MD, <Dictator> Zandra Lajeunesse Juanda ChanceSANCHEZ GUTIERRE MD ELECTRONICALLY SIGNED 10/05/2013 21:28

## 2014-10-22 NOTE — Discharge Summary (Signed)
PATIENT NAME:  Bruce Parsons, Bruce Parsons MR#:  [id number] DATE OF BIRTH:  08-May-1946  DATE OF ADMISSION:  09/10/2013 DATE OF DISCHARGE:  09/13/2013  ADMITTING DIAGNOSIS: Shortness of breath.   DISCHARGE DIAGNOSES:  1.  Acute on chronic respiratory failure due to acute on chronic diastolic congestive heart failure as well as chronic obstructive pulmonary disease exacerbation as well as possible bronchitis.  2.  Acute on chronic diastolic failure, now improved.  3.  Acute on chronic obstructive pulmonary disease exacerbation, now improved.  4.  Diabetes.  5.  Hypertension.  6.  History of coronary artery disease.  7.  Chronic atrial fibrillation.  8.  Coronary artery disease with stent placement.  9.  Hyperlipidemia.  10. Type 2 diabetes.  11. Urinary retention with bilateral hydronephrosis in the past.  12. Chronic respiratory failure on 2 L of oxygen.  13. Ongoing tobacco use.  14.  Sleep apnea, uses CPAP at nighttime.  15. Diabetic neuropathy.  16. Peripheral arterial disease.  17. Chronic thrombocytopenia.  18. Vitamin D deficiency.  PERTINENT LABS AND EVALUATIONS: Glucose 187, BUN 19, creatinine 1.29, sodium 135, potassium 4.6, chloride 100, CO2 is 32, calcium 8.4. LFTs are normal except albumin 2.7. WBC 6.9, hemoglobin 9.9, platelet count is 116. BMP on March 16 shows glucose 347, BUN 37, creatinine 1.41, sodium 131, potassium 4.8, chloride 94, CO2 is 32, calcium 8.7. Chest x-ray shows persistent congestive heart failure and possible pneumonia in the left base cannot be excluded.   HOSPITAL COURSE: Please refer to H and P done by the admitting physician. The patient is a 69 year old white male with history of diastolic heart failure, history of COPD, persistent tobacco abuse and sleep apnea, who presents with shortness of breath and cough, who was directly referred by [employer]. The patient had reported significant weight gain and swelling of the lower extremity. The patient was  admitted for acute on chronic COPD exacerbation as well as acute on chronic diastolic CHF. There was also concern for possible bronchitis, which he was treated with antibiotics. With diuresis, antibiotics, steroid therapy and nebulizer treatments, his breathing improved significantly. Currently, he is feeling back to baseline and is stable for discharge. Discharge instructions for CHF given.   DISCHARGE MEDICATIONS: Glimepiride 4 mg 1 tab p.o. b.i.d., metoprolol tartrate 25 mg 1 tab p.o. b.i.d., magnesium oxide 400 mg 1 tab p.o. b.i.d., Plavix 75 p.o. daily, trazodone 50 at bedtime, Lyrica 100 mg 1 tab p.o. b.i.d., omeprazole 40 daily, Lipitor 40 at bedtime, Advair 500/50, 1 puff  b.i.d., Spiriva 18 mcg daily, Ventolin 2 puffs q.6 hours p.r.n., Flomax 0.4 daily, albuterol inhalation 4 times a day as needed for shortness of breath, Singular 10 daily, MiraLax 17 grams daily, Wellbutrin XL 150 daily, Nasonex 2 sprays daily for allergies, Bactroban apply to affected area on the right leg b.i.d., Eucerin apply topically to affected area 1 to 2 times a day for dry skin, Imodium A-D 2 mg 2 tabs as needed for diarrhea, Tylenol 650 q.6 hours p.r.n. for pain, diltiazem 120 daily, Lantus 60 units at bedtime, Robitussin 10 mL q.6 hours p.r.n., Lasix 20 mg 1 tab p.o. b.i.d., prednisone taper start at 60, taper by 10 until complete, guaifenesin 600 mg 1 tab p.o. b.i.d., levofloxacin 500 mg 1 tab p.o. daily.   HOME OXYGEN: Yes, O2 via nasal cannula at 2 L.   DIET: Low sodium, low fat, low cholesterol, carbohydrate-controlled diet.   ACTIVITY: As tolerated.   FOLLOWUP: With the [facility] program. Follow  with primary M.D. in 1 to 2 weeks. BMP to be followed by primary care provider.   TIME SPENT: 35 minutes.  ____________________________ Lacie ScottsShreyang H. Allena KatzPatel, MD shp:aw D: 09/14/2013 09:47:18 ET T: 09/14/2013 12:07:42 ET JOB#: [id number]  cc: Rue Valladares H. Allena KatzPatel, MD, <Dictator> Charise CarwinSHREYANG H Brayon Bielefeld MD ELECTRONICALLY SIGNED  09/15/2013 8:26

## 2014-10-22 NOTE — H&P (Signed)
PATIENT NAME:  Bruce Parsons, Bruce Parsons MR#:  [id number] DATE OF BIRTH:  [birth year]  DATE OF ADMISSION:  08/19/2013  ADDENDUM   The patient actually meets SIRS criteria with tachycardia and increased respiratory rate on admission secondary to atrial fibrillation and acute congestive heart failure.    ____________________________ Zaylon Bossier P. Juliene PinaMody, MD spm:gb D: 08/19/2013 20:59:25 ET T: 08/19/2013 21:11:39 ET JOB#: [id number]  cc: Jaculin Rasmus P. Juliene PinaMody, MD, <Dictator> Janyth ContesSITAL P Dontea Corlew MD ELECTRONICALLY SIGNED 08/20/2013 10:11

## 2014-10-22 NOTE — Discharge Summary (Signed)
PATIENT NAME:  Bruce Parsons, Bruce Parsons MR#:  [id number] DATE OF BIRTH:  [birth year]  DATE OF ADMISSION:  08/19/2013 DATE OF DISCHARGE:  08/21/2013  PRESENTING COMPLAINT: Shortness of breath.   DISCHARGE DIAGNOSES: 1. Rapid atrial fibrillation, new onset, with rapid ventricular response, now in sinus rhythm.  2. Congestive heart failure, acute diastolic.  3. Chronic obstructive pulmonary disease with chronic respiratory failure and ongoing tobacco abuse on chronic home oxygen.  4. Hypertension.  5. Type 2 diabetes.   CODE STATUS: FULL CODE.   MEDICATIONS: 1. Glimepiride 4 mg b.i.d.  2. Metoprolol 25 mg b.i.d.  3. Magnesium oxide 400 mg b.i.d.  4. Plavix 75 mg daily.  5. Trazodone 50 mg at bedtime.  6. Lyrica 100 mg b.i.d.  7. Omeprazole 40 mg daily.  8. Lipitor 40 mg at bedtime.  9. Metformin 500 mg b.i.d.   10. Advair 500/50 1 puff b.i.d.  11. Spiriva 18 mcg inhalation one capsule inhalation daily.  12. Aspirin 81 mg daily.  13. Ventolin 2 puffs every six hours as needed.  14. Flomax 0.4 mg daily.  15. Albuterol DuoNebs 3 mL 4 times a day.  16. Singulair 10 mg at bedtime.  17. MiraLAX 17 grams dissolved in 8 ounces of water daily.  18. Wellbutrin XL 150 mg p.o. daily.  19. Nasonex 50 mcg/inhalations 2 sprays once a day.  20. Bactroban 2.2% topical ointment apply to affected area.  21. Eucerin topical cream.  22. Lasix 20 mg 1 tablet.  23. Imodium AD 2 mg 2 tablets p.Parsons.n.  24. Zofran 4 mg every eight hours as needed.  25. Tylenol 325 mg 2 tablets every six hours as needed.  26. Robitussin-DM 10 mL every six hours as needed.  27. Lantus 60 units b.i.d.  28. Norco FlexPen 10 units t.i.d. with meals.  29. Vitamin B12 50,000 1 international units 1 capsule once a month.  30. Diltiazem 120 mg daily.   FOLLOWUP:  1. Follow with the [facility] program in 1 to 2 weeks.  2. Followup with Dr. Mariah MillingGollan or Kirke CorinArida in 1 to 2 weeks.   DISCHARGE DIET: Low-sodium, low-fat, low-cholesterol, carbohydrate  -controlled diet.   The patient advised to continue his oxygen.   MRI of the brain, no acute intracranial abnormality. 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 Echo Doppler showed ejection fraction of 55% to 60%, mild aortic valve sclerosis without stenosis, mild to moderate mitral valve regurgitation, mildly dilated left and right atrium.   Basic metabolic panel within normal limits. Lipid profile within normal limits. TSH 4.75. Cardiac enzymes including troponin x 3 negative.   Urinalysis negative for urinary tract infection.   Chest x-ray consistent with congestive heart failure with mild interstitial edema.   Hgb is 11.0   White count is 6.6. B-type natriuretic peptide is 3807. EKG on admission showed atrial fibrillation with rapid ventricular response.   Cardiology consultation with Dr. Mariah MillingGollan.   CODE STATUS: NO CODE, DO NOT RESUSCITATE.   addendume to d/c summary dictated on 09/07/13 TIME SPENT: 40 minutes.  ____________________________ Wylie HailSona A. Allena KatzPatel, MD sap:sg D: 08/22/2013 07:09:57 ET T: 08/22/2013 13:46:03 ET JOB#: [id number]  cc: Kenneith Stief A. Allena KatzPatel, MD, <Dictator> Willow OraSONA A Fortunato Nordin MD ELECTRONICALLY SIGNED 09/07/2013 15:32

## 2014-10-23 NOTE — Discharge Summary (Signed)
PATIENT NAME:  Bruce Parsons, Bruce Parsons MR#:  [id number] DATE OF BIRTH:  01-04-1946  DATE OF ADMISSION:  11/29/2011 DATE OF DISCHARGE:  12/05/2011  PRIMARY CARE PHYSICIAN: Beverely RisenFozia Khan, MD  FINAL DIAGNOSES:  1. Osteomyelitis and gangrene status post amputation by Dr. Alberteen Spindleline. Please see operative report by Dr. Alberteen Spindleline.  2. Peripheral vascular disease status post angioplasty of the right lower extremity by Dr. Wyn Quakerew. Please see operative report by Dr. Wyn Quakerew. 3. Diabetes.  4. Chronic obstructive pulmonary disease.  5. Coronary artery disease.  6. Hyponatremia.  7. Tobacco abuse.  8. Increased CPK most likely postoperative.  9. Hypomagnesemia.  10. Constipation.  11. Hypoxia at night. Pulse oximetry 87%, but less than five minutes, that is required to qualify for night oxygen. 12. Insomnia.   DISCHARGE MEDICATIONS: 1. Plavix 75 mg daily.  2. Lipitor 20 mg at bedtime.  3. Vitamin D2 50,000 units weekly.  4. Glimepiride 4 mg twice a day.  5. Advair Diskus 500/50 one inhalation twice a day.  6. Spiriva 1 inhalation twice a day.  7. NovoLog 13 units four times daily with meals and at bedtime. 8. Aspirin 81 mg daily.  9. Lantus 60 units subcutaneous injection at night. 10. Ambien 10 mg at bedtime. 11. Metoprolol 25 mg twice a day.  12. Lyrica 75 mg twice a day.  13. DuoNeb nebulizer solution 1 inhalation every six hours as needed for shortness of breath.  14. Amlodipine 2.5 mg daily.  15. Nicotine patch 14 mcg daily to chest wall.  16. Levaquin 750 mg daily for at least 14 days.  17. Magnesium oxide 400 mg 1 tablet twice a day.  18. Norco 5/325 mg p.Parsons.n. pain.  DISCHARGE INSTRUCTIONS: Do not smoke cigarettes. Follow-up with Dr. Alberteen Spindleline next week. Dressing as per Dr. Alberteen Spindleline and home health care.   DIET: Regular, 1800 ADA diet.   DISCHARGE FOLLOWUP:  Followup with Dr. Beverely RisenFozia Khan in 1 to 2 weeks and with Dr. Wyn Quakerew in 2 to 4 weeks.   REASON FOR ADMISSION: The patient was admitted 11/29/2011 and discharged  12/05/2011. The patient came in with a fall and right foot swelling.   HISTORY OF PRESENT ILLNESS: This is a 69 year old man who had a fall, tripped over an object, fell and hurt his knee. He has also had an ulcer on his foot and had been there for at least four days and having discharge. He was admitted with diabetic foot ulcer with cellulitis, was started on IV antibiotics, seen in consultation by Dr. Alberteen Spindleline, and taken to the Operating Room the same day for gas gangrene of the foot. He had amputation and partial ray resection of first and second toes.  LABS/STUDIES: White blood cell count 23.4, hemoglobin 15, hematocrit 43.7, and platelet count 170. Glucose 180, BUN 14, creatinine 0.75, sodium 129, potassium 3.8, chloride 92, CO2 27, calcium 10.1, bilirubin 1.6, alkaline phosphatase 115, ALT 28, and AST 146.  Right foot complete showed gas in the soft tissues, presumably infection.   Blood cultures were negative.   Wound culture that took a long time to grow out grew out Staphylococcus aureus, Pseudomonas putida, and Prevotella intermedia.   EKG showed left axis deviation, septal infarct.   Chest x-ray showed interstitial pulmonary edema, atypical infection, differential consideration.   Urine culture was mixed bacterial.  Urinalysis: 1+ ketones, 3+ blood.   TSH 1.28.  Pathology showed gangrene of the first and second toes. Skin and soft tissue with chronic ulceration and necrosis. Underlying osteomyelitis. Focal  necrosis extends to soft tissue margin. Viable bone at margin.   Repeat wound culture negative, from the surgery.  Sodium was as high as 132 during the hospital stay, upon discharge was 129. Magnesium upon discharge 1.7, potassium 4.8, and creatinine 0.58.   HOSPITAL COURSE PER PROBLEM LIST:  1. For the patient's osteomyelitis and gas gangrene status post amputation, in speaking with Dr. Alberteen Spindle, the wound needed to be followed very closely, high risk for further amputation and  debridement, but since able to get better blood supply with angioplasty he feels that it is okay to continue with oral antibiotics, at least another two weeks. He will follow up and continue if still needed. Levaquin prescribed upon discharge. The patient was on clindamycin during the hospital stay. The patient is afebrile upon discharge, temperature 98. His last white count on 12/02/2011 was 6.9. Since it took a long time to get the cultures back, the patient was on clindamycin, which would cover the staff. I switched it to Levaquin, which has staph coverage and would also cover the Pseudomonas. Since it grew out of the wound culture and not the culture from the operating room, I am not sure if these are real organisms or contamination on the first culture, but I am going to treat it anyway. Again, close clinical follow-up with Dr. Alberteen Spindle, podiatry, as an outpatient. He set up home health to pack the wound. The patient understands that he is a high risk and needs to be followed up closely.  2. Peripheral vascular disease. He was status post angioplasty by Dr. Wyn Quaker. Please see the operative report by Dr. Wyn Quaker. He will follow-up with Dr. Wyn Quaker also as an outpatient.  3. Diabetes. He is on Lantus and NovoLog, which he will continue.  4. Chronic obstructive pulmonary disease. He is on Advair, Spiriva, and nebulizers. He did have wheeze when he came in. His lungs sounded better upon discharge. Continue inhalers. Smoking cessation advised.  5. Coronary artery disease. He is on aspirin and Plavix.  6. Hyponatremia. He was given IV fluids during the hospital stay. It really did not help the sodium. Could be secondary to chronic obstructive pulmonary disease. Can consider a CT scan of the chest as an outpatient for further evaluation.  7. Tobacco abuse. Smoking cessation counseling done, three minutes by me. Unsure if he will quit.  8. Increased CPK, most likely postoperative. Did come back to normal. Statin was restarted  upon discharge.  9. Hypomagnesemia. Magnesium supplementation done.  10. Constipation. The patient refused any medications for this upon discharge.  11. Hypoxia at night. We did an overnight oximetry. Pulse oximetry dropped down to 87%, but only for one minute so did not qualify for night oxygen.  12. For his insomnia, he is on Ambien and that dose was increased.  TIME SPENT ON DISCHARGE: 35 minutes.  ____________________________ Herschell Dimes. Renae Gloss, MD rjw:slb D: 12/05/2011 16:07:11 ET T: 12/06/2011 11:21:09 ET JOB#: [id number]  cc: Herschell Dimes. Renae Gloss, MD, <Dictator> Lyndon Code, MD Linus Galas, DPM Annice Needy, MD Salley Scarlet MD ELECTRONICALLY SIGNED 12/07/2011 15:22

## 2014-10-23 NOTE — Op Note (Signed)
PATIENT NAME:  Bruce Parsons, Bruce Parsons MR#:  [id number] DATE OF BIRTH:  19-Dec-1945  DATE OF PROCEDURE:  11/29/2011  PREOPERATIVE DIAGNOSIS: Gas gangrene, right forefoot.   POSTOPERATIVE DIAGNOSIS: Gas gangrene, right forefoot.   PROCEDURE: Amputation with partial ray resection, right first and second toes.   SURGEON: Linus Galasodd Iretha Kirley, DPM  ANESTHESIA: Local MAC.   HEMOSTASIS: Pneumatic tourniquet, right calf 250 mmHg.   ESTIMATED BLOOD LOSS: Minimal.   PATHOLOGY: Right first and second toes with corresponding joints and distal metatarsals.   PACKING: Gelfoam for hemostasis with saline wet-to-dry gauze.   COMPLICATIONS: None apparent.   OPERATIVE INDICATIONS: This is a [age] year old male with history of multiple surgeries on his right foot. Poorly controlled diabetes with neuropathy. Admitted for cellulitis and confirmed gas gangrene on x-rays.   DESCRIPTION OF PROCEDURE: Patient was taken to the Operating Room and placed on the table in the supine position. Following satisfactory ankle block performed by Dr. Pernell DupreAdams a pneumatic tourniquet was applied at the level of the right ankle and the foot was prepped and draped in the usual sterile fashion. The foot was elevationally exsanguinated and the tourniquet inflated to 250 mmHg.   Attention was then directed to the distal aspect of the right foot where an incision was made coursing from the lateral base of the second toe medially across the dorsal aspect of the foot and plantar aspect of the foot meeting at an apex along the first metatarsal shaft. The incisions were deepened sharply down to bone. Dissection was carried along the plane of the periosteum as discussed using a pneumatic saw, the distal first metatarsal was transected and then along with the second metatarsal. The first and second toes, joints and corresponding metatarsals were then resected in toto. There was noted to be a Silastic style implant in the great toe joint. This was removed in toto  along with the joint. The exception was a small amount of the stem still in the first metatarsal shaft and this was also removed. The wound was then excisionally debrided using a Versajet debrider on a setting of 7. A moderate amount of necrotic tissue was debrided. The wound was then flushed with copious amounts of sterile saline and then the incision was partially closed using 4-0 nylon simple interrupted sutures along the proximal medial portion of the incision and the very distalmost aspect. Next, a piece of Gelfoam was moistened with saline and then packed into the wound for hemostasis. A saline gauze was then packed through the additional portion of the wound. 4 x 4's, fluffs, ABDs and Kerlix were then applied followed by an ace wrap. The tourniquet was released. Patient tolerated the procedure and anesthesia well and was transported to the postanesthesia care unit with vital signs stable and in good condition.   ____________________________ Linus Galasodd Joei Frangos, DPM tc:cms D: 11/29/2011 18:00:45 ET T: 11/30/2011 10:00:26 ET JOB#: [id number]  cc: Linus Galasodd Sharlyne Koeneman, DPM, <Dictator> Neysa Arts DPM ELECTRONICALLY SIGNED 12/02/2011 9:44

## 2014-10-23 NOTE — H&P (Signed)
PATIENT NAME:  Bruce Parsons, Bruce Parsons MR#:  [id number] DATE OF BIRTH:  [birth year]  DATE OF ADMISSION:  11/29/2011  PRIMARY CARE PHYSICIAN:  Dr. Beverely RisenFozia Khan ER PHYSICIAN: Dr. Enedina FinnerGoli  ADMITTING PHYSICIAN: Dr. Tilda FrancoAkuneme   PRESENTING COMPLAINT: Fall and right foot swelling.  HISTORY OF PRESENT ILLNESS: The patient is a 69 year old man who presented to the hospital this afternoon following a fall earlier in the day. The patient stated that he had a mechanical fall, tripped over an object, fell, and hurt his knee. He presented to the Emergency Room due to pain in his knee. No associated swelling, PND, orthopnea, pedal edema. No redness. No bleeding or loss of consciousness. However, here in the Emergency Room he was noted to have a swollen right foot with an ulcer on the plantar surface of the foot which he said has been there for the last four days. With associated purulent discharge and swelling in the foot, he was referred to the hospitalist for further evaluation. The patient had an x-ray of the foot and wound culture. The patient admits to occasional fever, pain in the right lower extremity, otherwise no other problems.  He denies any recent change in medication, long distance travel, or sick contacts. No rashes.     REVIEW OF SYSTEMS: CONSTITUTIONAL: Admits to generalized fatigue, generalized weakness. No weight loss or weight gain. EYES: Denies any blurred vision, redness, swelling or discharge. ENT: No tinnitus, epistaxis, redness, or difficulty swallowing. RESPIRATORY: Admits to cough and wheezing intermittently. CARDIOVASCULAR: Denies any chest pain, orthopnea, or pedal edema. No exertional dyspnea or palpitations. No syncope. GI: No nausea, vomiting, diarrhea, abdominal pain, or change in bowel habits. GU: No dysuria, frequency, or incontinence. ENDO: No polyuria, polydipsia, increased sweating, heat or cold intolerance, or excessive thirst. HEMATOLOGIC: No anemia, easy bruising, or swollen glands. SKIN: No  rashes, but has  skin breakdown on his right foot. No change in hair or skin texture. MUSCULOSKELETAL: Has some pain in the right knee joint, swelling in the right foot, associated redness. NEURO: No tremors, vertigo, headaches, or seizures. PSYCH: No anxiety or depression.     PAST MEDICAL HISTORY:  1. Chronic obstructive pulmonary disease. Recommended for home oxygen, however has been off his oxygen for the last three months after returning from [place]. 2. Hypertension.  3. 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  4. Asthma.  5. Hyperlipidemia.  6. Coronary artery disease status post stent placement in 2008 with 80% circumflex occlusion on cardiac catheterization then and had drug-eluting stents placed. Repeat cardiac catheterization in March of 2009 which showed patent stent in the left circumflex. 7. History of depression. 8. Barrett's esophagitis status post EGD 2009. Colonoscopy done then was negative/normal. Had duodenal polyps, which were excised.   PAST SURGICAL HISTORY:  1. Cholecystectomy.  2. Hernia repair.  3. Right foot surgery.  4. Cardiac cauterization times two with stent placement in the left circumflex.   SOCIAL HISTORY: Lives alone at home, is a Nutritional therapistplumber by profession, currently retired. His son lives downstairs from him. Active tobacco use, currently down to half pack per day. No other recreational drug use.   FAMILY HISTORY: Positive for hypertension, type 2 diabetes.   ALLERGIES: Codeine.   MEDICATIONS:  1. Advair Diskus 500/50, 2 puffs twice daily.  2. Ambien 5 mg at bedtime p.Parsons.n.  3. Aspirin 325 mg daily.  4. Augmentin 875 mg 1 tablet twice a day.  5. Zithromax 250 mg daily.  6. Clonazepam 0.25 mg twice  a day.  7. Lantus 60 units subcutaneous daily.  8. Lipitor 20 mg daily.  9. Lopressor 50 mg twice a day.  10. NovoLog 30 units subcutaneous at bedtime with meals.  11. Omeprazole 20 mg enteric-coated, 1 tablet daily.  12. Plavix 75  mg daily.  13. Prednisone taper p.Parsons.n.  14. Spiriva 18 mcg daily.   PHYSICAL EXAMINATION: VITAL SIGNS: Temperature 97.9, pulse 115, respiratory rate 18, blood pressure 130/73, oxygen saturation 96 on room air.   GENERAL: Elderly male lying on the gurney. Awake, alert, oriented to time, place, and person, in no distress.   HEENT: Atraumatic, normocephalic. Pupils are equally round and reactive to light and accommodation. Extraocular movements intact. Mucous membranes pink, moist.   NECK: Supple. No JV distention.   CHEST: Good air entry. Generalized rhonchi, no rales.   HEART: Regular rate and rhythm. No murmurs.   ABDOMEN: Full, moves with respiration, nontender. Bowel sounds normoactive. No organomegaly.   EXTREMITIES: No edema, clubbing, or deformity except for right lower extremity with mild abrasion on the right knee currently dressed in the ER with Duo-Derm and right foot swelling with 2.5-cm diameter ulcer on the plantar surface of the foot, deep, associated swelling in the right foot, purulent discharge from the ulcer along with skin breakdown in between the first and second toes on the right foot. Sensation decreased.   NEUROLOGIC: Cranial nerves II to XII grossly intact. No motor deficit. Sensory system- pain stocking pattern.   PSYCH: Affect appropriate to situation.   LABORATORY, DIAGNOSTIC, AND RADIOLOGICAL DATA: EKG showed sinus rhythm, left axis deviation, septal infarct unknown with ventricular rate of 116. No prior EKG here for comparison. White count is 23, up from 10 from 2009. No recent labs for comparison. Hemoglobin 15, platelets 170. Chemistry shows sodium 129. The patient's sodium was 133 in 2009. It was 128 during that period. Looks like chronic hyponatremia. Potassium 3.8, bicarbonate 27, creatinine 0.7 with BUN 14, glucose 180, calcium 10. Albumin 3. Total bilirubin is 1.6, down from 1.7 from four years ago. AST 146, up from 38 four years ago. Mildly elevated LFTs,  AST in the past for which he had hepatitis profile done. This was negative in 2008.   IMPRESSION:  1. Diabetic foot ulcer with cellulitis, right foot.  2. Hyponatremia not otherwise specified. Consider SIADH.  3. Leukocytosis most likely secondary to cellulitis and diabetic foot ulcer.  4. Type 2 diabetes, poorly controlled.  5. Elevated LFTs not otherwise specified.  6. History of coronary artery disease, stable.  7. Chronic obstructive pulmonary disease with continued tobacco use.   8. Hypertension, stable.  9. Hyperlipidemia, stable.  10. Anxiety.  11. Peripheral neuropathy, stable. 12. Barretts esophagitis, stable. 13. Tobacco use noted.   PLAN: Admit to general medical floor, blood cultures times two. IV antibiotics. X-ray right foot. Pain control. Sliding scale insulin for blood sugar control. Serial cardiac enzymes. Check urine sodium osmolality. Trend LFTs. Nicotine patch offered. Smoking cessation advised. GI prophylaxis with omeprazole. Continue the rest of the outpatient medications and adjust as needed. Nebulizer p.Parsons.n., supply oxygen, respiratory support. CODE STATUS: FULL CODE.  TOTAL PATIENT CARE TIME: 50 minutes.   ____________________________ Floy Sabina Tilda Franco, MD mia:bjt D: 11/29/2011 02:03:16 ET T: 11/29/2011 08:28:18 ET JOB#: [id number]  cc: Jalonda Antigua I. Tilda Franco, MD, <Dictator> Lyndon Code, MD Margaret Pyle MD ELECTRONICALLY SIGNED 11/29/2011 22:37

## 2014-10-23 NOTE — Consult Note (Signed)
Asked to see patient regarding arterial assessment in patient s/p right great toe amputation for gangrene.  Foot wrapped in surgical dressing/ACE.  Has history of CAD and multiple atherosclerotic risk factors present.  PAD likely and would recommend assessment with angiogram.  Risks and benefits discussed and patient agreeable to proceed.  Have scheduled for Wednesday with either me or Dr. Gilda CreaseSchnier  Electronic Signatures: Annice Needyew, Jason S (MD)  (Signed on 03-Jun-13 15:37)  Authored  Last Updated: 03-Jun-13 15:37 by Annice Needyew, Jason S (MD)

## 2014-10-23 NOTE — Op Note (Signed)
PATIENT NAME:  Bruce Parsons, Bruce Parsons MR#:  [id number] DATE OF BIRTH:  08-22-1945  DATE OF PROCEDURE:  12/04/2011  PREOPERATIVE DIAGNOSES:  1. Peripheral arterial disease with gangrene right lower extremity, status post toe amputation.  2. Diabetes mellitus.  3. Hypertension.  4. Chronic obstructive pulmonary disease.  POSTOPERATIVE DIAGNOSES: 1. Peripheral arterial disease with gangrene right lower extremity, status post toe amputation.  2. Diabetes mellitus.  3. Hypertension.  4. Chronic obstructive pulmonary disease.  PROCEDURE PERFORMED:   1. Ultrasound guidance for vascular access, left femoral artery.  2. Catheter placement to right anterior tibial and posterior tibial arteries from left femoral approach.  3. Aortogram and selective right lower extremity angiogram.  4. Percutaneous transluminal angioplasty of proximal anterior tibial artery and tibioperoneal trunk with 4 mm diameter angioplasty balloon.  5. Percutaneous transluminal angioplasty of distal posterior tibial artery in the foot with 3 mm diameter angioplasty balloon.   SURGEON: Annice NeedyJason S. Dew, MD   ANESTHESIA: Local with moderate conscious sedation.   ESTIMATED BLOOD LOSS: Approximately 50 mL.   INDICATION FOR PROCEDURE: The patient is a 69 year old white male with a gangrenous right toe. He is status post toe amputation. He is brought down for an angiogram for further evaluation and treatment of his peripheral vascular disease. The risks and benefits were discussed. Informed consent was obtained.   DESCRIPTION OF PROCEDURE: The patient was brought to the Vascular Interventional Radiology Suite. The groins were shaved and prepped, and a sterile surgical field was created. The left femoral head was localized with fluoroscopy. Ultrasound was used to access the left femoral artery without difficulty with a Seldinger needle, and permanent image was recorded. A 5-French sheath was placed. A pigtail catheter was placed in the aorta at  the L1 level and AP aortogram was performed. This showed some ectasia of the infrarenal aorta without flow-limiting stenosis in the aortoiliac segments with patent renal arteries bilaterally. I then hooked the aortic bifurcation and advanced to the right femoral head, and selective right lower extremity angiogram was then performed. This showed a small profunda femoris artery without dominant stenosis. The superficial femoral artery was patent. The popliteal artery was patent. The tibial trifurcation had a high-grade stenosis at the origin of the anterior tibial artery and some disease in the tibioperoneal trunk as well. The peroneal artery was small but continuous. The posterior tibial artery was patent but had a high-grade stenosis at the level of the ankle. I gave 3,000 units of intravenous heparin for systemic anticoagulation and placed a 6-French Ansel sheath. A V18 wire was then used. I initially cannulated the anterior tibial artery without difficulty with the help of a 135 angled catheter. The vessels were quite large, and a 4 mm diameter x 4 cm length angioplasty balloon was inflated. A waist was taken which resolved. Following this, the anterior tibial artery was patent, but the plaque  had now caused a significant stenosis of the tibioperoneal trunk. I moved the wire into the posterior tibial artery and used the same 4 mm diameter angioplasty balloon on the tibioperoneal trunk with good angiographic result, and the anterior tibial artery remained patent. I then crossed the lesion in the posterior tibial artery at the level of the foot and ankle without difficulty with the help of a 150 CXI catheter and used a 3 mm diameter angioplasty balloon inflated in this location. Following this, there was brisk flow with excellent perfusion into the foot and no significant residual stenosis. At this point, I elected to  terminate the procedure. The sheath was pulled back to the ipsilateral external iliac artery.  Oblique arteriogram was performed. A Perclose closure device was deployed but did not obtain hemostasis, so 15 minutes of manual pressure was held. The patient tolerated the procedure well and was taken to the recovery room in stable condition.  ____________________________ Annice Needy, MD jsd:cbb D: 12/04/2011 16:47:33 ET T: 12/04/2011 17:44:17 ET JOB#: [id number]  cc: Annice Needy, MD, <Dictator> Linus Galas, DPM Lyndon Code, MD Annice Needy MD ELECTRONICALLY SIGNED 12/05/2011 13:15

## 2014-10-23 NOTE — Consult Note (Signed)
PATIENT NAME:  Bruce Parsons, Bruce Parsons MR#:  [id number] DATE OF BIRTH:  10-18-1945  DATE OF CONSULTATION:  11/29/2011  REFERRING PHYSICIAN:   CONSULTING PHYSICIAN:  Linus Galas, DPM  REASON FOR CONSULTATION: This is a 69 year old male with a history of diabetes and neuropathy who relates a fall where he tripped a while back. He presented to the emergency room due to pain in his knee. He was noted to have a swollen right foot with an ulcer on the foot. X-rays were taken in the emergency department which revealed gas in the subcutaneous tissues and degenerative change around the first metatarsal also noted possibly consistent with osteomyelitis or previous surgery. The patient has had some fever and chills.   PAST MEDICAL HISTORY:  1. Type 2 diabetes with peripheral neuropathy, requiring insulin.  2. Chronic obstructive pulmonary disease.  3. Hypertension.  4. Asthma.  5. Hyperlipidemia.  6. Coronary artery disease status post stent placement.  7. History of depression.  8. Barrett's esophagus status post EGD in 2009.   PAST SURGICAL HISTORY:  1. Relates 6 or 7 foot surgeries on his right foot, has had some previous debridement for infection.  2. Cholecystectomy.  3. Hernia repair.  4. Cardiac catheterization with stent placement.   HOME MEDICATIONS:  1. Advair Diskus 500/50 two puffs twice daily.  2. Ambien 5 mg at bedtime p.r.n.  3. Aspirin 325 mg daily.  4. Augmentin 875 mg 1 tablet twice a day.  5. Zithromax 250 mg daily.  6. Clonazepam 0.25 mg twice daily.  7. Lantus 60 units subcutaneous daily.  8. Lipitor 20 mg daily.  9. Lopressor 50 mg twice daily. 10. NovoLog 30 units subcutaneous at bedtime and with meals.  11. Omeprazole 20 mg enteric coated 1 tablet daily.  12. Plavix 75 mg daily.  13. Prednisone taper p.r.n.  14. Spiriva 18 mcg daily.   ALLERGIES: Codeine.   FAMILY HISTORY: Diabetes and hypertension.   SOCIAL HISTORY: He lives alone. Retired Nutritional therapist. He does relate  tobacco use down to about 1/2 pack per day. Denies any recreational drug use.   REVIEW OF SYSTEMS: Significant swelling and redness in the right foot with some drainage. Significant numbness and neuropathy in both lower extremities. Does relate recent fever and chills. Denies any cough or shortness of breath. No stomach pain, heartburn, or nausea. Denies hearing or vision changes.   PHYSICAL EXAMINATION:   VASCULAR: DP and PT pulses are palpable. Capillary filling time remains intact to the digits.   NEUROLOGICAL: Complete loss of sensation distally in the feet and toes.   INTEGUMENT: Skin is warm, dry, and somewhat atrophic. Significant erythema and edema in the right foot with a large plantar ulceration beneath the right first metatarsal head as well as full thickness ulceration with heavy purulent discharge and necrotic debris in the first interspace. Significant malodor is noted.   MUSCULOSKELETAL: Adequate range of motion of the pedal joints.   X-RAYS: Multiple views of the right foot revealed gas in the subcutaneous tissues around the first metatarsal as well as into the second toe. Significant lytic changes are noted in the first metatarsal head and the great toe joint. Also postsurgical degenerative changes of the right fifth metatarsal.   LABS: White count is significantly elevated at 23,000.   IMPRESSION: 1. Osteomyelitis, right forefoot with gas gangrene.  2. Diabetes with peripheral neuropathy.   PLAN: We will plan on incision and drainage of the right foot with partial ray resection of the first and second  later on today. After speaking with anesthesia, we will try to do this emergently within the next few hours. The patient last ate around 1 o'clock, but we will go ahead and plan for emergently performing debridement.  ____________________________ Linus Galasodd Rajvi Armentor, DPM tc:slb D: 11/29/2011 13:46:51 ET T: 11/29/2011 13:59:57 ET JOB#: [id number]  cc: Linus Galasodd Artesia Berkey, DPM, <Dictator> Lyndon CodeFozia  M. Khan, MD Hiawatha Dressel DPM ELECTRONICALLY SIGNED 12/02/2011 9:43

## 2014-10-23 NOTE — Consult Note (Signed)
PATIENT NAME:  Bruce Parsons, Bruce Parsons MR#:  [id number] DATE OF BIRTH:  Apr 03, 1946  DATE OF CONSULTATION:  12/02/2011  REFERRING PHYSICIAN:  Linus Galas, DPM CONSULTING PHYSICIAN:  Annice Needy, MD  REASON FOR CONSULTATION: A gangrenous right great toe, assess his arterial circulation.   HISTORY OF PRESENT ILLNESS: The patient is a 69 year old male who was admitted with infected gangrenous right great toe. This has been removed by Podiatry. This wound is currently wrapped. This was done over the weekend. He has a history of coronary disease and other atherosclerotic risk factors including hypertension and tobacco dependence. The foot is painful, although the pain has improved after his surgery. It was difficult to discern a history of claudication prior to this incident, but he did have some pain with activity. He did not have ischemic rest pain prior to this infected toe. The left lower extremity does not have current symptoms. He has had no previous arterial revascularizations to his lower extremities to his knowledge.   PAST MEDICAL HISTORY: Significant for: 1. Coronary disease.  2. Chronic obstructive pulmonary disease.  3. Hypertension.  4. Diabetes mellitus.  5. Hyperlipidemia.  6. Barrett's esophagitis.   PAST SURGICAL HISTORY:  1. Heart catheterizations with previous percutaneous intervention.  2. Right foot surgery for his gangrenous toe.  3. Hernia repair.  4. Cholecystectomy.   SOCIAL HISTORY: He lives alone. He is a Nutritional therapist. Ongoing tobacco use is present.   FAMILY HISTORY: Positive for hypertension and diabetes.   ALLERGIES: Codeine.   HOME MEDICATIONS:  1. Advair Diskus 500/50 b.i.d.  2. Ambien 5 mg at bedtime.  3. Aspirin 325 mg daily.  4. Augmentin 875 b.i.d.  5. Zithromax 250 daily.  6. Clonazepam 0.25 mg b.i.d. . 7. Lantus 60 units daily.  8. Lipitor 20 mg daily.  9. Lopressor 50 mg b.i.d.  10. NovoLog 30 units subcutaneously at bedtime.  11. Omeprazole 20 mg daily.   12. Plavix 75 mg daily.  13. Prednisone taper as needed.  14. Spiriva 18 mcg daily.   REVIEW OF SYSTEMS: CONSTITUTIONAL: Positive for some fever which has improved after his surgery for the infected toe. No intentional weight loss or gain. EYES: No blurred or double vision. EARS: No tinnitus or ear pain. CARDIOVASCULAR: No chest pain or palpitations. RESPIRATORY: No shortness of breath or cough. GASTROINTESTINAL: No nausea, vomiting, or diarrhea. GU: No dysuria or hematuria. ENDOCRINE: No heat or cold intolerance. PSYCHIATRIC: No anxiety or depression. NEUROLOGIC: No transient ischemic attack, stroke or seizure. MUSCULOSKELETAL: Positive for diabetic neuropathy and his right foot infection. Skin breakdown on the right foot. HEMATOLOGIC: No anemia or easy bruising.   PHYSICAL EXAMINATION:  GENERAL: This is a well-developed, well-nourished white male not in apparent distress.   VITAL SIGNS: Temperature 98.2, pulse 86, blood pressure 150/87. Saturations are 93% on room air.   HEENT: Head: Normocephalic and atraumatic. Eyes: Sclerae are anicteric. Conjunctivae are clear. Ears: Normal external appearance. Hearing is intact.   NECK: Neck is supple without adenopathy or jugular venous distention.   HEART: Regular rate and rhythm without murmurs, rubs, gallops.   LUNGS: Clear to auscultation bilaterally without increased respiratory effort.  ABDOMEN: Soft, nondistended, nontender.   EXTREMITIES: His right foot is wrapped in a surgical dressing and an Ace wrap up to above the ankle. This makes palpation of his pedal pulses difficult. His popliteal pulse is 1+ on the right. On the left, his dorsalis pedis pulse is 1+, his posterior tibial pulse is 1+, as is his  popliteal pulse.   SKIN: Again, his right foot is dressed. Otherwise, no clear breakdown or problems in the left foot.   NEUROLOGICAL: Decreased sensation of the lower extremities.   PSYCHIATRIC: Normal affect and mood.   LABORATORY DATA:  Sodium 132, potassium 3.4, chloride 96, CO2 27, BUN 9, creatinine 0.53, glucose was 228. White blood cell count 6.9, hemoglobin 12.0, platelet count 110,000.   ASSESSMENT AND PLAN: The patient is a 69 year old white male with multiple atherosclerotic risk factors including diabetes, hypertension, hyperlipidemia, and ongoing tobacco use, who has a history of coronary disease and has recently had a right foot infection. Peripheral arterial disease would be highly suspected, and we will plan on getting him on the schedule for an angiogram for further evaluation and possible treatment within the next 1 to 2 days. The risks and benefits of the procedure were discussed with the patient. The gravity of the situation with the high attendant risk of limb loss was also discussed with the patient and the fact that if he does have arterial insufficiency that is not treated the risk of limb loss is much higher, and even with revascularization there is still a chance of limb loss. He voiced his understanding.   This is a level-5 consultation.   ____________________________ Annice NeedyJason S. Dew, MD jsd:cbb D: 12/11/2011 13:19:18 ET T: 12/11/2011 15:07:26 ET JOB#: [id number]  cc: Annice NeedyJason S. Dew, MD, <Dictator> Annice NeedyJASON S DEW MD ELECTRONICALLY SIGNED 12/19/2011 9:05
